# Patient Record
Sex: Male | Born: 1950 | Race: Black or African American | Hispanic: No | Marital: Married | Smoking: Never smoker
Health system: Southern US, Community
[De-identification: ages and names within clinical notes are randomized; demographics above are authoritative.]

## PROBLEM LIST (undated history)

## (undated) DIAGNOSIS — H269 Unspecified cataract: Secondary | ICD-10-CM

## (undated) DIAGNOSIS — C189 Malignant neoplasm of colon, unspecified: Secondary | ICD-10-CM

## (undated) HISTORY — DX: Unspecified cataract: H26.9

## (undated) HISTORY — DX: Malignant neoplasm of colon, unspecified: C18.9

---

## 2004-02-24 ENCOUNTER — Ambulatory Visit: Payer: Self-pay | Admitting: Internal Medicine

## 2005-06-01 ENCOUNTER — Ambulatory Visit: Payer: Self-pay | Admitting: Internal Medicine

## 2005-06-18 ENCOUNTER — Ambulatory Visit: Payer: Self-pay | Admitting: Internal Medicine

## 2006-11-24 ENCOUNTER — Ambulatory Visit: Payer: Self-pay | Admitting: Internal Medicine

## 2006-11-24 DIAGNOSIS — M25569 Pain in unspecified knee: Secondary | ICD-10-CM | POA: Insufficient documentation

## 2006-11-24 DIAGNOSIS — F528 Other sexual dysfunction not due to a substance or known physiological condition: Secondary | ICD-10-CM

## 2006-11-24 DIAGNOSIS — I10 Essential (primary) hypertension: Secondary | ICD-10-CM | POA: Insufficient documentation

## 2006-11-24 LAB — CONVERTED CEMR LAB: Testosterone: 424.74 ng/dL (ref 350.00–890)

## 2007-03-30 HISTORY — PX: COLECTOMY: SHX59

## 2007-06-13 ENCOUNTER — Telehealth (INDEPENDENT_AMBULATORY_CARE_PROVIDER_SITE_OTHER): Payer: Self-pay | Admitting: *Deleted

## 2007-06-20 ENCOUNTER — Ambulatory Visit: Payer: Self-pay | Admitting: Internal Medicine

## 2007-06-20 DIAGNOSIS — K625 Hemorrhage of anus and rectum: Secondary | ICD-10-CM | POA: Insufficient documentation

## 2007-06-22 LAB — CONVERTED CEMR LAB
Basophils Relative: 0.6 % (ref 0.0–1.0)
Eosinophils Absolute: 0.2 10*3/uL (ref 0.0–0.6)
Eosinophils Relative: 3.8 % (ref 0.0–5.0)
HCT: 39.2 % (ref 39.0–52.0)
Hemoglobin: 12.6 g/dL — ABNORMAL LOW (ref 13.0–17.0)
Lymphocytes Relative: 28.5 % (ref 12.0–46.0)
MCHC: 32.3 g/dL (ref 30.0–36.0)
MCV: 83.8 fL (ref 78.0–100.0)
Monocytes Absolute: 0.4 10*3/uL (ref 0.2–0.7)
Monocytes Relative: 9.1 % (ref 3.0–11.0)
Platelets: 209 10*3/uL (ref 150–400)
WBC: 4.8 10*3/uL (ref 4.5–10.5)

## 2007-06-28 ENCOUNTER — Ambulatory Visit: Payer: Self-pay | Admitting: Gastroenterology

## 2007-06-30 ENCOUNTER — Telehealth: Payer: Self-pay | Admitting: Internal Medicine

## 2007-07-20 ENCOUNTER — Ambulatory Visit: Payer: Self-pay | Admitting: Gastroenterology

## 2007-07-20 ENCOUNTER — Encounter: Payer: Self-pay | Admitting: Internal Medicine

## 2007-07-20 ENCOUNTER — Encounter: Payer: Self-pay | Admitting: Gastroenterology

## 2007-07-20 LAB — CONVERTED CEMR LAB: Creatinine, Ser: 1.1 mg/dL (ref 0.4–1.5)

## 2007-07-24 ENCOUNTER — Ambulatory Visit: Payer: Self-pay | Admitting: Cardiovascular Disease

## 2007-07-27 ENCOUNTER — Encounter: Payer: Self-pay | Admitting: Gastroenterology

## 2007-08-23 ENCOUNTER — Encounter: Payer: Self-pay | Admitting: Internal Medicine

## 2007-08-23 ENCOUNTER — Encounter (INDEPENDENT_AMBULATORY_CARE_PROVIDER_SITE_OTHER): Payer: Self-pay | Admitting: *Deleted

## 2007-08-23 ENCOUNTER — Inpatient Hospital Stay (HOSPITAL_COMMUNITY): Admission: RE | Admit: 2007-08-23 | Discharge: 2007-08-27 | Payer: Self-pay | Admitting: General Surgery

## 2007-08-23 ENCOUNTER — Encounter (INDEPENDENT_AMBULATORY_CARE_PROVIDER_SITE_OTHER): Payer: Self-pay | Admitting: General Surgery

## 2007-08-23 DIAGNOSIS — C189 Malignant neoplasm of colon, unspecified: Secondary | ICD-10-CM

## 2007-08-23 HISTORY — DX: Malignant neoplasm of colon, unspecified: C18.9

## 2007-08-31 ENCOUNTER — Encounter: Payer: Self-pay | Admitting: Internal Medicine

## 2007-09-13 ENCOUNTER — Ambulatory Visit: Payer: Self-pay | Admitting: Hematology and Oncology

## 2007-09-15 ENCOUNTER — Encounter: Payer: Self-pay | Admitting: Gastroenterology

## 2007-09-19 ENCOUNTER — Encounter: Payer: Self-pay | Admitting: Internal Medicine

## 2007-09-19 LAB — CBC WITH DIFFERENTIAL/PLATELET
Basophils Absolute: 0 10*3/uL (ref 0.0–0.1)
EOS%: 2.6 % (ref 0.0–7.0)
HGB: 11.4 g/dL — ABNORMAL LOW (ref 13.0–17.1)
MCH: 26.7 pg — ABNORMAL LOW (ref 28.0–33.4)
NEUT#: 3.3 10*3/uL (ref 1.5–6.5)
RDW: 14.8 % — ABNORMAL HIGH (ref 11.2–14.6)
lymph#: 1.3 10*3/uL (ref 0.9–3.3)

## 2007-09-19 LAB — COMPREHENSIVE METABOLIC PANEL
Albumin: 4.3 g/dL (ref 3.5–5.2)
Alkaline Phosphatase: 67 U/L (ref 39–117)
BUN: 12 mg/dL (ref 6–23)
CO2: 26 mEq/L (ref 19–32)
Calcium: 9.1 mg/dL (ref 8.4–10.5)
Chloride: 106 mEq/L (ref 96–112)
Glucose, Bld: 105 mg/dL — ABNORMAL HIGH (ref 70–99)
Potassium: 4.3 mEq/L (ref 3.5–5.3)
Sodium: 143 mEq/L (ref 135–145)
Total Protein: 7 g/dL (ref 6.0–8.3)

## 2007-09-19 LAB — CEA: CEA: 0.9 ng/mL (ref 0.0–5.0)

## 2007-09-21 ENCOUNTER — Ambulatory Visit (HOSPITAL_COMMUNITY): Admission: RE | Admit: 2007-09-21 | Discharge: 2007-09-21 | Payer: Self-pay | Admitting: Hematology and Oncology

## 2007-09-21 ENCOUNTER — Encounter (INDEPENDENT_AMBULATORY_CARE_PROVIDER_SITE_OTHER): Payer: Self-pay | Admitting: *Deleted

## 2007-09-26 ENCOUNTER — Encounter: Payer: Self-pay | Admitting: Gastroenterology

## 2007-10-11 ENCOUNTER — Encounter: Payer: Self-pay | Admitting: Internal Medicine

## 2007-10-11 ENCOUNTER — Encounter: Payer: Self-pay | Admitting: Gastroenterology

## 2007-10-11 LAB — COMPREHENSIVE METABOLIC PANEL
Albumin: 4.5 g/dL (ref 3.5–5.2)
BUN: 14 mg/dL (ref 6–23)
Calcium: 9.4 mg/dL (ref 8.4–10.5)
Chloride: 106 mEq/L (ref 96–112)
Glucose, Bld: 131 mg/dL — ABNORMAL HIGH (ref 70–99)
Potassium: 4 mEq/L (ref 3.5–5.3)

## 2007-10-11 LAB — CBC WITH DIFFERENTIAL/PLATELET
Basophils Absolute: 0 10*3/uL (ref 0.0–0.1)
Eosinophils Absolute: 0.2 10*3/uL (ref 0.0–0.5)
HCT: 38.2 % — ABNORMAL LOW (ref 38.7–49.9)
HGB: 12.7 g/dL — ABNORMAL LOW (ref 13.0–17.1)
MCV: 81.4 fL — ABNORMAL LOW (ref 81.6–98.0)
MONO%: 9 % (ref 0.0–13.0)
NEUT#: 3.3 10*3/uL (ref 1.5–6.5)
NEUT%: 59 % (ref 40.0–75.0)
RDW: 15.2 % — ABNORMAL HIGH (ref 11.2–14.6)

## 2007-10-20 ENCOUNTER — Encounter: Payer: Self-pay | Admitting: Gastroenterology

## 2007-10-20 ENCOUNTER — Ambulatory Visit: Payer: Self-pay | Admitting: Hematology and Oncology

## 2007-10-25 ENCOUNTER — Encounter: Payer: Self-pay | Admitting: Internal Medicine

## 2007-10-25 ENCOUNTER — Encounter: Payer: Self-pay | Admitting: Gastroenterology

## 2007-10-25 LAB — CBC WITH DIFFERENTIAL/PLATELET
Basophils Absolute: 0 10*3/uL (ref 0.0–0.1)
Eosinophils Absolute: 0.1 10*3/uL (ref 0.0–0.5)
HGB: 12.8 g/dL — ABNORMAL LOW (ref 13.0–17.1)
MONO#: 0.6 10*3/uL (ref 0.1–0.9)
NEUT#: 3 10*3/uL (ref 1.5–6.5)
RDW: 14.6 % (ref 11.2–14.6)
lymph#: 1.4 10*3/uL (ref 0.9–3.3)

## 2007-10-25 LAB — COMPREHENSIVE METABOLIC PANEL
AST: 14 U/L (ref 0–37)
Albumin: 4.4 g/dL (ref 3.5–5.2)
BUN: 9 mg/dL (ref 6–23)
Calcium: 9.5 mg/dL (ref 8.4–10.5)
Chloride: 104 mEq/L (ref 96–112)
Glucose, Bld: 117 mg/dL — ABNORMAL HIGH (ref 70–99)
Potassium: 4.1 mEq/L (ref 3.5–5.3)

## 2007-11-02 LAB — CBC WITH DIFFERENTIAL/PLATELET
BASO%: 1.1 % (ref 0.0–2.0)
EOS%: 2.6 % (ref 0.0–7.0)
HCT: 39.2 % (ref 38.7–49.9)
LYMPH%: 32.8 % (ref 14.0–48.0)
MCH: 27.4 pg — ABNORMAL LOW (ref 28.0–33.4)
MCHC: 33.2 g/dL (ref 32.0–35.9)
MONO#: 0.6 10*3/uL (ref 0.1–0.9)
NEUT%: 51.9 % (ref 40.0–75.0)
Platelets: 212 10*3/uL (ref 145–400)
RBC: 4.76 10*6/uL (ref 4.20–5.71)
WBC: 5 10*3/uL (ref 4.0–10.0)
lymph#: 1.6 10*3/uL (ref 0.9–3.3)

## 2007-11-20 ENCOUNTER — Telehealth: Payer: Self-pay | Admitting: *Deleted

## 2007-11-22 ENCOUNTER — Encounter: Payer: Self-pay | Admitting: Gastroenterology

## 2007-11-22 ENCOUNTER — Encounter: Payer: Self-pay | Admitting: Internal Medicine

## 2007-11-22 LAB — CBC WITH DIFFERENTIAL/PLATELET
BASO%: 0.3 % (ref 0.0–2.0)
EOS%: 2.5 % (ref 0.0–7.0)
HCT: 39.7 % (ref 38.7–49.9)
MCH: 28.1 pg (ref 28.0–33.4)
MCHC: 33.7 g/dL (ref 32.0–35.9)
MONO#: 0.7 10*3/uL (ref 0.1–0.9)
RDW: 16.8 % — ABNORMAL HIGH (ref 11.2–14.6)
WBC: 4.3 10*3/uL (ref 4.0–10.0)
lymph#: 1.3 10*3/uL (ref 0.9–3.3)

## 2007-11-22 LAB — COMPREHENSIVE METABOLIC PANEL
ALT: 55 U/L — ABNORMAL HIGH (ref 0–53)
AST: 55 U/L — ABNORMAL HIGH (ref 0–37)
Albumin: 4.4 g/dL (ref 3.5–5.2)
CO2: 25 mEq/L (ref 19–32)
Calcium: 9.2 mg/dL (ref 8.4–10.5)
Chloride: 103 mEq/L (ref 96–112)
Potassium: 4.2 mEq/L (ref 3.5–5.3)
Sodium: 140 mEq/L (ref 135–145)
Total Protein: 6.9 g/dL (ref 6.0–8.3)

## 2007-12-11 ENCOUNTER — Encounter: Payer: Self-pay | Admitting: Gastroenterology

## 2007-12-11 ENCOUNTER — Encounter: Payer: Self-pay | Admitting: Internal Medicine

## 2007-12-11 ENCOUNTER — Ambulatory Visit: Payer: Self-pay | Admitting: Hematology and Oncology

## 2007-12-13 ENCOUNTER — Encounter: Payer: Self-pay | Admitting: Gastroenterology

## 2007-12-13 ENCOUNTER — Encounter: Payer: Self-pay | Admitting: Internal Medicine

## 2007-12-13 LAB — CBC WITH DIFFERENTIAL/PLATELET
BASO%: 0.4 % (ref 0.0–2.0)
EOS%: 2.7 % (ref 0.0–7.0)
MCH: 28.8 pg (ref 28.0–33.4)
MCHC: 34.1 g/dL (ref 32.0–35.9)
MONO#: 0.6 10*3/uL (ref 0.1–0.9)
RBC: 4.28 10*6/uL (ref 4.20–5.71)
RDW: 18.3 % — ABNORMAL HIGH (ref 11.2–14.6)
WBC: 4 10*3/uL (ref 4.0–10.0)
lymph#: 1.4 10*3/uL (ref 0.9–3.3)

## 2007-12-13 LAB — COMPREHENSIVE METABOLIC PANEL
ALT: 38 U/L (ref 0–53)
AST: 43 U/L — ABNORMAL HIGH (ref 0–37)
CO2: 24 mEq/L (ref 19–32)
Calcium: 8.7 mg/dL (ref 8.4–10.5)
Chloride: 105 mEq/L (ref 96–112)
Sodium: 140 mEq/L (ref 135–145)
Total Protein: 6.5 g/dL (ref 6.0–8.3)

## 2008-01-03 ENCOUNTER — Encounter: Payer: Self-pay | Admitting: Gastroenterology

## 2008-01-03 ENCOUNTER — Encounter: Payer: Self-pay | Admitting: Internal Medicine

## 2008-01-03 LAB — COMPREHENSIVE METABOLIC PANEL
ALT: 33 U/L (ref 0–53)
AST: 45 U/L — ABNORMAL HIGH (ref 0–37)
Albumin: 4.3 g/dL (ref 3.5–5.2)
Alkaline Phosphatase: 64 U/L (ref 39–117)
Glucose, Bld: 117 mg/dL — ABNORMAL HIGH (ref 70–99)
Potassium: 3.9 mEq/L (ref 3.5–5.3)
Sodium: 142 mEq/L (ref 135–145)
Total Protein: 6.6 g/dL (ref 6.0–8.3)

## 2008-01-03 LAB — CBC WITH DIFFERENTIAL/PLATELET
BASO%: 0.3 % (ref 0.0–2.0)
Eosinophils Absolute: 0.1 10*3/uL (ref 0.0–0.5)
MCV: 85.6 fL (ref 81.6–98.0)
MONO%: 14.2 % — ABNORMAL HIGH (ref 0.0–13.0)
NEUT#: 2.8 10*3/uL (ref 1.5–6.5)
RBC: 4.15 10*6/uL — ABNORMAL LOW (ref 4.20–5.71)
RDW: 20.7 % — ABNORMAL HIGH (ref 11.2–14.6)
WBC: 4.9 10*3/uL (ref 4.0–10.0)

## 2008-01-24 ENCOUNTER — Encounter: Payer: Self-pay | Admitting: Internal Medicine

## 2008-01-24 LAB — CBC WITH DIFFERENTIAL/PLATELET
BASO%: 0.5 % (ref 0.0–2.0)
EOS%: 2.5 % (ref 0.0–7.0)
LYMPH%: 30.5 % (ref 14.0–48.0)
MCH: 29.6 pg (ref 28.0–33.4)
MCHC: 33.8 g/dL (ref 32.0–35.9)
MCV: 87.6 fL (ref 81.6–98.0)
MONO%: 17.1 % — ABNORMAL HIGH (ref 0.0–13.0)
NEUT#: 2.4 10*3/uL (ref 1.5–6.5)
RBC: 4.36 10*6/uL (ref 4.20–5.71)
RDW: 21.1 % — ABNORMAL HIGH (ref 11.2–14.6)

## 2008-01-25 LAB — COMPREHENSIVE METABOLIC PANEL
ALT: 41 U/L (ref 0–53)
Albumin: 4.7 g/dL (ref 3.5–5.2)
CO2: 24 mEq/L (ref 19–32)
Calcium: 9.6 mg/dL (ref 8.4–10.5)
Chloride: 102 mEq/L (ref 96–112)
Creatinine, Ser: 1.16 mg/dL (ref 0.40–1.50)
Potassium: 4.3 mEq/L (ref 3.5–5.3)
Total Protein: 7.5 g/dL (ref 6.0–8.3)

## 2008-01-25 LAB — CEA: CEA: 1.4 ng/mL (ref 0.0–5.0)

## 2008-02-12 ENCOUNTER — Ambulatory Visit: Payer: Self-pay | Admitting: Hematology and Oncology

## 2008-02-14 ENCOUNTER — Encounter: Payer: Self-pay | Admitting: Gastroenterology

## 2008-02-14 LAB — COMPREHENSIVE METABOLIC PANEL
Albumin: 4.4 g/dL (ref 3.5–5.2)
Alkaline Phosphatase: 70 U/L (ref 39–117)
CO2: 26 mEq/L (ref 19–32)
Glucose, Bld: 108 mg/dL — ABNORMAL HIGH (ref 70–99)
Potassium: 4.2 mEq/L (ref 3.5–5.3)
Sodium: 139 mEq/L (ref 135–145)
Total Protein: 7.1 g/dL (ref 6.0–8.3)

## 2008-02-14 LAB — CBC WITH DIFFERENTIAL/PLATELET
BASO%: 0.6 % (ref 0.0–2.0)
EOS%: 2.1 % (ref 0.0–7.0)
HCT: 36.2 % — ABNORMAL LOW (ref 38.7–49.9)
LYMPH%: 30.2 % (ref 14.0–48.0)
MCH: 30.5 pg (ref 28.0–33.4)
MCHC: 34.3 g/dL (ref 32.0–35.9)
MCV: 89 fL (ref 81.6–98.0)
MONO#: 0.8 10*3/uL (ref 0.1–0.9)
MONO%: 18.3 % — ABNORMAL HIGH (ref 0.0–13.0)
NEUT%: 48.8 % (ref 40.0–75.0)
Platelets: 127 10*3/uL — ABNORMAL LOW (ref 145–400)
RBC: 4.07 10*6/uL — ABNORMAL LOW (ref 4.20–5.71)
WBC: 4.6 10*3/uL (ref 4.0–10.0)

## 2008-03-06 ENCOUNTER — Encounter: Payer: Self-pay | Admitting: Gastroenterology

## 2008-04-02 ENCOUNTER — Ambulatory Visit (HOSPITAL_COMMUNITY): Admission: RE | Admit: 2008-04-02 | Discharge: 2008-04-02 | Payer: Self-pay | Admitting: Hematology and Oncology

## 2008-04-02 ENCOUNTER — Encounter (INDEPENDENT_AMBULATORY_CARE_PROVIDER_SITE_OTHER): Payer: Self-pay | Admitting: *Deleted

## 2008-04-02 ENCOUNTER — Ambulatory Visit: Payer: Self-pay | Admitting: Hematology and Oncology

## 2008-04-04 ENCOUNTER — Encounter: Payer: Self-pay | Admitting: Gastroenterology

## 2008-04-04 LAB — CBC WITH DIFFERENTIAL/PLATELET
BASO%: 0.4 % (ref 0.0–2.0)
Eosinophils Absolute: 0.1 10*3/uL (ref 0.0–0.5)
MCHC: 33.6 g/dL (ref 32.0–35.9)
MCV: 92.9 fL (ref 81.6–98.0)
MONO#: 0.8 10*3/uL (ref 0.1–0.9)
MONO%: 19.4 % — ABNORMAL HIGH (ref 0.0–13.0)
NEUT#: 1.9 10*3/uL (ref 1.5–6.5)
RBC: 3.99 10*6/uL — ABNORMAL LOW (ref 4.20–5.71)
RDW: 16.8 % — ABNORMAL HIGH (ref 11.2–14.6)
WBC: 4.3 10*3/uL (ref 4.0–10.0)

## 2008-04-05 LAB — COMPREHENSIVE METABOLIC PANEL
ALT: 34 U/L (ref 0–53)
AST: 44 U/L — ABNORMAL HIGH (ref 0–37)
CO2: 20 mEq/L (ref 19–32)
Sodium: 141 mEq/L (ref 135–145)
Total Bilirubin: 0.6 mg/dL (ref 0.3–1.2)
Total Protein: 7 g/dL (ref 6.0–8.3)

## 2008-04-05 LAB — CEA: CEA: 1.7 ng/mL (ref 0.0–5.0)

## 2008-05-15 ENCOUNTER — Encounter (INDEPENDENT_AMBULATORY_CARE_PROVIDER_SITE_OTHER): Payer: Self-pay | Admitting: *Deleted

## 2008-07-22 ENCOUNTER — Ambulatory Visit: Payer: Self-pay | Admitting: Gastroenterology

## 2008-07-23 ENCOUNTER — Ambulatory Visit: Payer: Self-pay | Admitting: Hematology and Oncology

## 2008-07-25 ENCOUNTER — Ambulatory Visit (HOSPITAL_COMMUNITY): Admission: RE | Admit: 2008-07-25 | Discharge: 2008-07-25 | Payer: Self-pay | Admitting: Hematology and Oncology

## 2008-07-25 ENCOUNTER — Encounter (INDEPENDENT_AMBULATORY_CARE_PROVIDER_SITE_OTHER): Payer: Self-pay | Admitting: *Deleted

## 2008-07-25 LAB — CBC WITH DIFFERENTIAL/PLATELET
Basophils Absolute: 0 10*3/uL (ref 0.0–0.1)
EOS%: 3.2 % (ref 0.0–7.0)
Eosinophils Absolute: 0.2 10*3/uL (ref 0.0–0.5)
HGB: 13.1 g/dL (ref 13.0–17.1)
NEUT#: 2.7 10*3/uL (ref 1.5–6.5)
RDW: 13.2 % (ref 11.0–14.6)
lymph#: 1.7 10*3/uL (ref 0.9–3.3)

## 2008-07-25 LAB — COMPREHENSIVE METABOLIC PANEL
Albumin: 4.6 g/dL (ref 3.5–5.2)
BUN: 13 mg/dL (ref 6–23)
Calcium: 9 mg/dL (ref 8.4–10.5)
Chloride: 105 mEq/L (ref 96–112)
Glucose, Bld: 102 mg/dL — ABNORMAL HIGH (ref 70–99)
Potassium: 3.9 mEq/L (ref 3.5–5.3)

## 2008-08-01 ENCOUNTER — Encounter: Payer: Self-pay | Admitting: Internal Medicine

## 2008-08-06 ENCOUNTER — Ambulatory Visit: Payer: Self-pay | Admitting: Gastroenterology

## 2008-11-28 ENCOUNTER — Ambulatory Visit: Payer: Self-pay | Admitting: Hematology and Oncology

## 2008-12-03 LAB — CBC WITH DIFFERENTIAL/PLATELET
Basophils Absolute: 0 10*3/uL (ref 0.0–0.1)
EOS%: 3.2 % (ref 0.0–7.0)
HCT: 37.6 % — ABNORMAL LOW (ref 38.4–49.9)
HGB: 12.6 g/dL — ABNORMAL LOW (ref 13.0–17.1)
MCH: 27.6 pg (ref 27.2–33.4)
MONO#: 0.4 10*3/uL (ref 0.1–0.9)
NEUT%: 56.1 % (ref 39.0–75.0)
lymph#: 1.6 10*3/uL (ref 0.9–3.3)

## 2008-12-03 LAB — COMPREHENSIVE METABOLIC PANEL
BUN: 12 mg/dL (ref 6–23)
CO2: 27 mEq/L (ref 19–32)
Calcium: 9.3 mg/dL (ref 8.4–10.5)
Chloride: 109 mEq/L (ref 96–112)
Creatinine, Ser: 1.27 mg/dL (ref 0.40–1.50)

## 2008-12-04 LAB — CEA: CEA: 0.8 ng/mL (ref 0.0–5.0)

## 2008-12-05 ENCOUNTER — Encounter: Payer: Self-pay | Admitting: Gastroenterology

## 2009-03-27 ENCOUNTER — Ambulatory Visit: Payer: Self-pay | Admitting: Hematology and Oncology

## 2009-04-02 LAB — COMPREHENSIVE METABOLIC PANEL
ALT: 70 U/L — ABNORMAL HIGH (ref 0–53)
AST: 61 U/L — ABNORMAL HIGH (ref 0–37)
Albumin: 4.6 g/dL (ref 3.5–5.2)
BUN: 12 mg/dL (ref 6–23)
CO2: 29 mEq/L (ref 19–32)
Chloride: 104 mEq/L (ref 96–112)
Glucose, Bld: 104 mg/dL — ABNORMAL HIGH (ref 70–99)
Sodium: 139 mEq/L (ref 135–145)

## 2009-04-02 LAB — CBC WITH DIFFERENTIAL/PLATELET
Basophils Absolute: 0 10*3/uL (ref 0.0–0.1)
HCT: 40.4 % (ref 38.4–49.9)
MCV: 83.3 fL (ref 79.3–98.0)
MONO#: 0.6 10*3/uL (ref 0.1–0.9)
RBC: 4.85 10*6/uL (ref 4.20–5.82)
RDW: 14.1 % (ref 11.0–14.6)
WBC: 6 10*3/uL (ref 4.0–10.3)

## 2009-04-02 LAB — CEA: CEA: 1 ng/mL (ref 0.0–5.0)

## 2009-04-04 ENCOUNTER — Encounter: Payer: Self-pay | Admitting: Internal Medicine

## 2009-07-25 ENCOUNTER — Ambulatory Visit: Payer: Self-pay | Admitting: Hematology and Oncology

## 2009-07-28 ENCOUNTER — Ambulatory Visit (HOSPITAL_COMMUNITY): Admission: RE | Admit: 2009-07-28 | Discharge: 2009-07-28 | Payer: Self-pay | Admitting: Hematology and Oncology

## 2009-07-28 ENCOUNTER — Encounter (INDEPENDENT_AMBULATORY_CARE_PROVIDER_SITE_OTHER): Payer: Self-pay | Admitting: *Deleted

## 2009-07-28 LAB — COMPREHENSIVE METABOLIC PANEL
Alkaline Phosphatase: 58 U/L (ref 39–117)
BUN: 10 mg/dL (ref 6–23)
CO2: 26 mEq/L (ref 19–32)
Chloride: 108 mEq/L (ref 96–112)
Creatinine, Ser: 1.11 mg/dL (ref 0.40–1.50)
Glucose, Bld: 101 mg/dL — ABNORMAL HIGH (ref 70–99)

## 2009-07-28 LAB — CBC WITH DIFFERENTIAL/PLATELET
BASO%: 0.4 % (ref 0.0–2.0)
Basophils Absolute: 0 10*3/uL (ref 0.0–0.1)
EOS%: 3 % (ref 0.0–7.0)
Eosinophils Absolute: 0.1 10*3/uL (ref 0.0–0.5)
HCT: 40.7 % (ref 38.4–49.9)
HGB: 13.3 g/dL (ref 13.0–17.1)
LYMPH%: 38.5 % (ref 14.0–49.0)
MCH: 27.4 pg (ref 27.2–33.4)
MONO#: 0.4 10*3/uL (ref 0.1–0.9)
MONO%: 8.7 % (ref 0.0–14.0)
WBC: 4.4 10*3/uL (ref 4.0–10.3)
lymph#: 1.7 10*3/uL (ref 0.9–3.3)

## 2009-08-01 ENCOUNTER — Encounter (INDEPENDENT_AMBULATORY_CARE_PROVIDER_SITE_OTHER): Payer: Self-pay | Admitting: *Deleted

## 2009-08-06 ENCOUNTER — Encounter: Payer: Self-pay | Admitting: Internal Medicine

## 2009-08-06 ENCOUNTER — Encounter (INDEPENDENT_AMBULATORY_CARE_PROVIDER_SITE_OTHER): Payer: Self-pay | Admitting: *Deleted

## 2009-11-05 ENCOUNTER — Ambulatory Visit: Payer: Self-pay | Admitting: Gastroenterology

## 2009-11-05 DIAGNOSIS — Z85038 Personal history of other malignant neoplasm of large intestine: Secondary | ICD-10-CM

## 2009-11-21 ENCOUNTER — Ambulatory Visit: Payer: Self-pay | Admitting: Gastroenterology

## 2010-01-28 ENCOUNTER — Ambulatory Visit: Payer: Self-pay | Admitting: Hematology and Oncology

## 2010-01-30 LAB — CBC WITH DIFFERENTIAL/PLATELET
Basophils Absolute: 0 10*3/uL (ref 0.0–0.1)
HCT: 42.1 % (ref 38.4–49.9)
MCH: 27.7 pg (ref 27.2–33.4)
MCHC: 33 g/dL (ref 32.0–36.0)
MCV: 83.9 fL (ref 79.3–98.0)
MONO#: 0.5 10*3/uL (ref 0.1–0.9)
NEUT#: 2.8 10*3/uL (ref 1.5–6.5)
RBC: 5.02 10*6/uL (ref 4.20–5.82)
RDW: 14 % (ref 11.0–14.6)
WBC: 5.3 10*3/uL (ref 4.0–10.3)
nRBC: 0 % (ref 0–0)

## 2010-01-30 LAB — COMPREHENSIVE METABOLIC PANEL
Albumin: 4.5 g/dL (ref 3.5–5.2)
Alkaline Phosphatase: 59 U/L (ref 39–117)
BUN: 12 mg/dL (ref 6–23)
Calcium: 9.2 mg/dL (ref 8.4–10.5)
Chloride: 105 mEq/L (ref 96–112)
Creatinine, Ser: 1.31 mg/dL (ref 0.40–1.50)
Potassium: 4.4 mEq/L (ref 3.5–5.3)
Total Bilirubin: 0.6 mg/dL (ref 0.3–1.2)
Total Protein: 7.3 g/dL (ref 6.0–8.3)

## 2010-02-03 ENCOUNTER — Encounter: Payer: Self-pay | Admitting: Gastroenterology

## 2010-02-04 LAB — HEPATITIS B SURFACE ANTIGEN: Hepatitis B Surface Ag: NEGATIVE

## 2010-02-04 LAB — HEPATITIS C ANTIBODY: HCV Ab: NEGATIVE

## 2010-02-04 LAB — HEPATITIS B SURFACE ANTIBODY,QUALITATIVE: Hep B S Ab: NEGATIVE

## 2010-04-28 NOTE — Letter (Signed)
Summary: [facility]  [address] [place], [place] [address]   Phone: [phone]  Fax: [phone]       Jeffery [name]    06-02-1950    MRN: [id number]        Procedure Day /Date: Friday August 26th, 2011     Arrival Time:3:00pm      Procedure Time:4:00pm     Location of Procedure:                    _ x_  [facility] (4th Floor)    PREPARATION FOR FLEXIBLE SIGMOIDOSCOPY WITH MAGNESIUM CITRATE  Prior to the day before your procedure, purchase one 8 oz. bottle of Magnesium Citrate and one Fleet Enema from the laxative section of your drugstore.  _________________________________________________________________________________________________  THE DAY BEFORE YOUR PROCEDURE             DATE: 11/20/09      DAY: Thursday  1.   Have a clear liquid dinner the night before your procedure.  2.   Do not drink anything colored red or purple.  Avoid juices with pulp.  No orange juice.              CLEAR LIQUIDS INCLUDE: Water Jello Ice Popsicles Tea (sugar ok, no milk/cream) Powdered fruit flavored drinks Coffee (sugar ok, no milk/cream) Gatorade Juice: apple, white grape, white cranberry  Lemonade Clear bullion, consomm, broth Carbonated beverages (any kind) Strained chicken noodle soup Hard Candy   3.   At 7:00 pm the night before your procedure, drink one bottle of Magnesium Citrate over ice.  4.   Drink at least 3 more glasses of clear liquids before bedtime (preferably juices).  5.   Results are expected usually within 1 to 6 hours after taking the Magnesium Citrate.  ___________________________________________________________________________________________________  THE DAY OF YOUR PROCEDURE            DATE: 11/21/09     DAY: Friday  1.   Use Fleet Enema one hour prior to coming for procedure.  2.   You may drink clear liquids until 2:00pm (2 hours before exam)       MEDICATION INSTRUCTIONS  Unless  otherwise instructed, you should take regular prescription medications with a small sip of water as early as possible the morning of your procedure.        OTHER INSTRUCTIONS  You will need a responsible adult at least 62 years of age to accompany you and drive you home.   This person must remain in the waiting room during your procedure.  Wear loose fitting clothing that is easily removed.  Leave jewelry and other valuables at home.  However, you may wish to bring a book to read or an iPod/MP3 player to listen to music as you wait for your procedure to start.  Remove all body piercing jewelry and leave at home.  Total time from sign-in until discharge is approximately 2-3 hours.  You should go home directly after your procedure and rest.  You can resume normal activities the day after your procedure.  The day of your procedure you should not:   Drive   Make legal decisions   Operate machinery   Drink alcohol   Return to work  You will receive specific instructions about eating, activities and medications before you leave.   The above instructions have been reviewed and explained to me by   Marchelle Folks.     I fully  understand and can verbalize these instructions _____________________________ Date _________

## 2010-04-28 NOTE — Letter (Signed)
Summary: [facility]  [facility]   Imported By: Lennie Odor 02/09/2010 11:47:37  _____________________________________________________________________  External Attachment:    Type:   Image     Comment:   External Document

## 2010-04-28 NOTE — Consult Note (Signed)
Summary: [facility]   Imported By: Sherian Rein 09/12/2009 13:08:40  _____________________________________________________________________  External Attachment:    Type:   Image     Comment:   External Document

## 2010-04-28 NOTE — Letter (Signed)
[facility]   Imported By: Maryln Gottron 05/01/2009 11:11:34  _____________________________________________________________________  External Attachment:    Type:   Image     Comment:   External Document

## 2010-04-28 NOTE — Assessment & Plan Note (Signed)
Summary: SCREEN FOR RECALL/YF   History of Present Illness Visit Type: follow up Primary GI MD: Elie Goody MD [facility] Primary Provider: Birdie Sons, MD  Requesting Provider: Jamie Brookes. Odogwu, MD  Chief Complaint: Recall Sigmoidoscopy. History of colon cancer.  Pt c/o bloating  History of Present Illness:   This is a 60 year old male with a history of synchronous T4 and T3 colon cancers, status post subtotal colectomy in 2009. He is status post chemotherapy and has done well since that time with no evidence of recurrence. He had a negative CT scan in May 2011. He notes no change in bowel habits, melena, hematochezia or weight loss. He generally has 4-5 stools daily.   GI Review of Systems    Reports bloating.      Denies abdominal pain, acid reflux, belching, chest pain, dysphagia with liquids, dysphagia with solids, heartburn, loss of appetite, nausea, vomiting, vomiting blood, weight loss, and  weight gain.        Denies anal fissure, black tarry stools, change in bowel habit, constipation, diarrhea, diverticulosis, fecal incontinence, heme positive stool, hemorrhoids, irritable bowel syndrome, jaundice, light color stool, liver problems, rectal bleeding, and  rectal pain.   Current Medications (verified): 1)  Tylenol 325 Mg Tabs (Acetaminophen) .... One Tablet By Mouth As Needed 2)  Cialis 20 Mg Tabs (Tadalafil) .... Take One Tablet By Mouth As Needed 3)  Mens 50+ Advanced  Caps (Multiple Vitamins-Minerals) .... One Capsule By Mouth Once Daily  Allergies (verified): No Known Drug Allergies  Past History:  Past Medical History: Synchronous T4, N0 and T3, N0 colonic adenocarcinoma, 06/2007 ED  Hypertension Hemorrhoids  Past Surgical History: Subcolectomy 07/2007  Family History: No FH of Colon Cancer:  Social History: Supervisor Married 2 Childern Patient has never smoked.  Daily Caffeine Use: 2 daily  Illicit Drug Use - no Alcohol Use - no Smoking Status:   never Drug Use:  no  Review of Systems       The pertinent positives and negatives are noted as above and in the HPI. All other ROS were reviewed and were negative.   Vital Signs:  Patient profile:   60 year old male Height:      67 inches Weight:      188 pounds BMI:     29.55 BSA:     1.97 Pulse rate:   68 / minute Pulse rhythm:   regular BP sitting:   142 / 80  (left arm) Cuff size:   regular  Vitals Entered By: Ok Anis CMA (November 05, 2009 2:21 PM)  Physical Exam  General:  Well developed, well nourished, no acute distress. Head:  Normocephalic and atraumatic. Eyes:  PERRLA, no icterus. Mouth:  No deformity or lesions, dentition normal. Lungs:  Clear throughout to auscultation. Heart:  Regular rate and rhythm; no murmurs, rubs,  or bruits. Abdomen:  Soft, nontender and nondistended. No masses, hepatosplenomegaly or hernias noted. Normal bowel sounds. Rectal:  deferred until time of colonoscopy.   Neurologic:  Alert and  oriented x4;  grossly normal neurologically. Psych:  Alert and cooperative. Normal mood and affect.  Impression & Recommendations:  Problem # 1:  PERSONAL HX COLON CANCER (ICD-V10.05) History of synchronous T4 and T3 colonic adenocarcinomas, status post total colectomy in 2009. He is due for surveillance with sigmoidoscopy. He was given the option of a nonsedated or sedated procedure. Orders: Flex with Sedation (Flex w/Sed)  Patient Instructions: 1)   Flexible Sigmoidoscopy brochure given.  2)  Copy sent to: Arlan Organ, MD 3)  The medication list was reviewed and reconciled.  All changed / newly prescribed medications were explained.  A complete medication list was provided to the patient / caregiver.

## 2010-04-28 NOTE — Letter (Signed)
Summary: New Patient letter  [facility]  [address] [place], [place] [address]   Phone: [phone]  Fax: [phone]       08/06/2009 MRN: [id number]  Jeffery [name] [address] [place], [place]  [address]  Dear Mr. Judithann Graves,  Welcome to the Gastroenterology Division at [facility].    You are scheduled to see Dr.  Russella Dar on  09-15-09 at 3:30pm on the 3rd floor at [facility], [address]  We ask that you try to arrive at our office 15 minutes prior to your appointment time to allow for check-in.  We would like you to complete the enclosed self-administered evaluation form prior to your visit and bring it with you on the day of your appointment.  We will review it with you.  Also, please bring a complete list of all your medications or, if you prefer, bring the medication bottles and we will list them.  Please bring your insurance card so that we may make a copy of it.  If your insurance requires a referral to see a specialist, please bring your referral form from your primary care physician.  Co-payments are due at the time of your visit and may be paid by cash, check or credit card.     Your office visit will consist of a consult with your physician (includes a physical exam), any laboratory testing he/she may order, scheduling of any necessary diagnostic testing (e.g. x-ray, ultrasound, CT-scan), and scheduling of a procedure (e.g. Endoscopy, Colonoscopy) if required.  Please allow enough time on your schedule to allow for any/all of these possibilities.    If you cannot keep your appointment, please call [phone] to cancel or reschedule prior to your appointment date.  This allows [place] the opportunity to schedule an appointment for another patient in need of care.  If you do not cancel or reschedule by 5 p.m. the business day prior to your appointment date, you will be charged a $50.00 late cancellation/no-show fee.    Thank you for choosing  [facility] for your medical needs.  We appreciate the opportunity to care for you.  Please visit [place] at our website  to learn more about our practice.                     Sincerely,                                                             The Gastroenterology Division

## 2010-04-28 NOTE — Discharge Summary (Signed)
Summary: Colon cancer/ Total abdominal colectomy  NAME:  Jeffery [name], Jeffery [name]               ACCOUNT NO.:  [id number]      MEDICAL RECORD NO.:  [id number]          PATIENT TYPE:  INP      LOCATION:  1527                         FACILITY:  [facility]      PHYSICIAN:  [name], M.D.DATE OF BIRTH:  1950-06-25      DATE OF ADMISSION:  08/23/2007   DATE OF DISCHARGE:  08/27/2007                                  DISCHARGE SUMMARY      FINAL DIAGNOSIS:  Colonic adenocarcinoma, ascending colon and sigmoid   colon.      SECONDARY DIAGNOSES:  None.      PROCEDURE:  Total abdominal colectomy.      REASON FOR ADMISSION:  This 60 year old male had some blood in his stool   and change in stool caliber.  He underwent a colonoscopy and a lesion in   the ascending colon and sigmoid colon were identified, biopsied and both   positive for adenocarcinoma.  He now presents for the above procedure.      HOSPITAL COURSE:  He was admitted and underwent the total abdominal   colectomy with the anastomosis between the ileum and rectum.  He   actually tolerated this very well.  He began having flatus and bowel   movements by his second postoperative day and his diet was advanced.   His wound looked good.  By his fourth postoperative day he was   tolerating a diet, had good bowel function, the wound looked good, and   he was discharged.      DISPOSITION:  Discharged to home in satisfactory condition Aug 27, 2007.   He will to come to see me in 5-7 days for staple removal.  He is given   Tylox for pain and discharge instructions.               [name], M.D.   Electronically Signed            TJR/MEDQ  D:  09/15/2007  T:  09/15/2007  Job:  [id number]      cc:   [name], MD, FACG   [address]   [place]   [place] [address]      [facility]

## 2010-04-28 NOTE — Letter (Signed)
Summary: Flex Sigmoidoscopy Letter  [facility]  [address] [place], [place] [address]   Phone: [phone]  Fax: [phone]      Aug 01, 2009 MRN: [id number]   Jeffery [name] [address] [place], [place]  [address]   Dear Jeffery [name],   According to your medical record, it is time for you to schedule a Flexible Sigmoidoscopy. The American Cancer Society recommends this procedure as a method to detect early colon cancer. Patients with a family history of colon cancer, or a personal history of colon polyps or imflammatory bowel disease are at increased risk.  This letter has been generated based on the recommendations made at the time of your prior procedure. If you feel that in your particular situation this may no longer apply, please contact our office.  Please call our office at [phone]) to schedule this appointment or to update your records at your earliest convenience.  Thank you for cooperating with [place] to provide you with the very best care possible.   Sincerely,  Judie Petit T. Russella Dar, M.D.  [facility] [phone]

## 2010-04-28 NOTE — Procedures (Signed)
Summary: Flexible Sigmoidoscopy  Patient: Jeffery [name] Note: All result statuses are Final unless otherwise noted.  Tests: (1) Flexible Sigmoidoscopy (FLX)  FLX Flexible Sigmoidoscopy                             DONE     [facility]     [address]     [place], [place]  [address]           FLEXIBLE SIGMOIDOSCOPY PROCEDURE REPORT     PATIENT:  Jeffery [name], Jeffery [name]  MR#:  [id number]     BIRTHDATE:  01-25-1951, 59 yrs. old  GENDER:  male     ENDOSCOPIST:  Judie Petit T. Russella Dar, MD, [facility]           PROCEDURE DATE:  11/21/2009     PROCEDURE:  Flexible Sigmoidoscopy, diagnostic     ASA CLASS:  Class II     INDICATIONS:  history of synchronous T4 and T3 colon cancers,     2009, high risk screening and surveillance     MEDICATIONS:   Fentanyl 50 mcg IV, Versed 5 mg IV           DESCRIPTION OF PROCEDURE:   After the risks benefits and     alternatives of the procedure were thoroughly explained, informed     consent was obtained.  Digital rectal exam was performed and     revealed no abnormalities.   The [id number] endoscope     was introduced through the anus and advanced to the ileum, without     limitations.  The quality of the prep was good.  The instrument     was then slowly withdrawn as the mucosa was fully examined.     <<PROCEDUREIMAGES>>     There was a surgical anastomosis in the proximal rectum. The     distal neoterminal ileum, rectum and anastomosis were normal in     appearance.  Retroflexed views in the rectum revealed small     hemorrhoids.  The scope was then withdrawn from the patient and     the procedure completed.     COMPLICATIONS:  None           ENDOSCOPIC IMPRESSION:     1) Ileocolonic anastomosis in the rectum     2) Small internal hemorrhoids           RECOMMENDATIONS:     1) Continue surveillance with Flex Sig in 2 years           Malcolm T. Russella Dar, MD, [facility]           CC:  Arlan Organ, MD           n.     Rosalie DoctorVenita Lick. Stark  at 11/21/2009 03:51 PM           Barbee [place]ropshire, [id number]  Note: An exclamation mark (!) indicates a result that was not dispersed into the flowsheet. Document Creation Date: 11/21/2009 3:52 PM _______________________________________________________________________  (1) Order result status: Final Collection or observation date-time: 11/21/2009 15:42 Requested date-time:  Receipt date-time:  Reported date-time:  Referring Physician:   Ordering Physician: Claudette Head [phone]) Specimen Source:  Source: Launa Grill Order Number: [id number] Lab site:   Appended Document: Flexible Sigmoidoscopy     Procedures Next Due Date:    Flexible Sigmoidoscopy: 10/2011

## 2010-04-28 NOTE — Letter (Signed)
[facility]   Imported By: Maryln Gottron 08/21/2009 13:46:15  _____________________________________________________________________  External Attachment:    Type:   Image     Comment:   External Document

## 2010-08-04 ENCOUNTER — Other Ambulatory Visit: Payer: Self-pay | Admitting: Hematology and Oncology

## 2010-08-04 ENCOUNTER — Encounter (HOSPITAL_BASED_OUTPATIENT_CLINIC_OR_DEPARTMENT_OTHER): Payer: 59 | Admitting: Hematology and Oncology

## 2010-08-04 DIAGNOSIS — C189 Malignant neoplasm of colon, unspecified: Secondary | ICD-10-CM

## 2010-08-04 DIAGNOSIS — C182 Malignant neoplasm of ascending colon: Secondary | ICD-10-CM

## 2010-08-04 DIAGNOSIS — C187 Malignant neoplasm of sigmoid colon: Secondary | ICD-10-CM

## 2010-08-04 LAB — COMPREHENSIVE METABOLIC PANEL
Albumin: 4.9 g/dL (ref 3.5–5.2)
BUN: 12 mg/dL (ref 6–23)
CO2: 20 mEq/L (ref 19–32)
Calcium: 9.2 mg/dL (ref 8.4–10.5)
Chloride: 107 mEq/L (ref 96–112)
Glucose, Bld: 93 mg/dL (ref 70–99)
Potassium: 4.1 mEq/L (ref 3.5–5.3)
Total Protein: 7.6 g/dL (ref 6.0–8.3)

## 2010-08-04 LAB — CBC WITH DIFFERENTIAL/PLATELET
Basophils Absolute: 0 10*3/uL (ref 0.0–0.1)
Eosinophils Absolute: 0.2 10*3/uL (ref 0.0–0.5)
HCT: 40.3 % (ref 38.4–49.9)
HGB: 13.1 g/dL (ref 13.0–17.1)
MCH: 26.7 pg — ABNORMAL LOW (ref 27.2–33.4)
MONO#: 0.5 10*3/uL (ref 0.1–0.9)
NEUT#: 3.4 10*3/uL (ref 1.5–6.5)
NEUT%: 54.2 % (ref 39.0–75.0)
WBC: 6.4 10*3/uL (ref 4.0–10.3)
lymph#: 2.2 10*3/uL (ref 0.9–3.3)

## 2010-08-11 NOTE — Op Note (Signed)
NAME:  Jeffery [name], Jeffery [name] NO.:  [id number]   MEDICAL RECORD NO.:  [id number]          PATIENT TYPE:  INP   LOCATION:  0008                         FACILITY:  [facility]   PHYSICIAN:  [name], M.D.DATE OF BIRTH:  Jul 29, 1950   DATE OF PROCEDURE:  08/23/2007  DATE OF DISCHARGE:                               OPERATIVE REPORT   PREOPERATIVE DIAGNOSIS:  Right colon cancer and sigmoid colon cancer.   POSTOPERATIVE DIAGNOSIS:  Right colon cancer and sigmoid colon cancer.   PROCEDURE:  Total colectomy.   SURGEON:  [name], M.D.   ASSISTANT:  Lennie Muckle, MD.   ANESTHESIA:  General.   INDICATIONS:  This 57-year male had some rectal bleeding and underwent  colonoscopy.  He is found to have to malignancies, one in the ascending  colon and one in the sigmoid colon.  CT scan is negative for metastatic  disease.  He is now brought to the operating room for the above  procedure.   TECHNIQUE:  He is brought to the operating room, placed supine on the  operating table and general anesthetic was administered.  Foley catheter  was inserted and he is placed in the lithotomy position.  The hair on  the abdominal wall was clipped and the abdominal wall and perineum were  sterilely prepped and draped.  A long midline incision was made dividing  the skin, subcutaneous tissue, fascia and peritoneum entering the  peritoneal cavity.  I then explored the peritoneal cavity.  The liver  was smooth without lesions.  No small bowel lesions were noted.  No drop  metastasis into the pelvis were noted.   I approached the ascending colon and mobilized it by dividing the  lateral attachments using electrocautery and medialized the colon.  I  then proceeded to dissect the omentum free from the transverse colon.  I  then mobilized the descending colon by dividing its lateral attachments  and then mobilized the splenic flexure as well using electrocautery and  the LigaSure.   Following this, the sigmoid colon was mobilized after  identification of the left ureter and I traced it down into the pelvis.  I then reached the rectosigmoid junction.   At the distal ileum, I divided the terminal ileum with the GIA stapler.  I then identified the right ureter.  Staying above this, I divided the  mesentery using LigaSure and ligating the ileocolic and right colic  vessels.  I then mobilized the hepatic flexure  further and there was  some bleeding around the mesentery which was controlled with hemoclips  and stitches  close to the pancreas.  I then divided the middle colic  vessels and ligated them with silk ties.  I divided the left colic  vessel, ligated it with silk tie.  I then proceeded down toward the  sigmoid vessels, taking a large sample of mesentery around the entire  colon area.  I brought the mesenteric resection up to the rectosigmoid  junction, dividing the sigmoid vessels with the LigaSure above the plane  of the left ureter.  Using the GIA stapler,  I then divided the colon at  the rectosigmoid junction.  The distal portion of the specimen was  marked with a suture and sent to pathology.   At this point, I irrigated out the area and noted that hemostasis was  adequate.  I then retrieved the distal ileal stump and removed the  staple line.  I passed a size 25 EEA anvil into the terminal ileum and  then closed the end with GIA stapler.  I then brought the anvil out the  antimesenteric side and secured it with a 2-0 Prolene pursestring  suture.   Following this, the 25 EEA handle was then passed through the anus up to  the rectum and a stapled anastomosis in a Baker type fashion between the  terminal ileum and the rectosigmoid junction was performed.  Two solid  doughnuts were retrieved and the distal doughnut sent for the distal-  most margin.   I then occluded the ileum just proximal to the anastomosis, placed the  anastomosis under water and did an  air leak test which was negative.   Following this, I reinspected the abdomen and noted hemostasis was  adequate.  Abdomen was irrigated.  Irrigation fluid evacuated.   I then asked for needle, sponge and instrument count.  It was reported  to be correct at this time.  Omentum was then placed over the viscera.  I then closed the fascia with a running #1 PDS suture.  The subcutaneous  tissue was irrigated and the skin was closed with staples.   He tolerated procedure without apparent complications and was taken to  the recovery room in satisfactory condition.      [name], M.D.  Electronically Signed     TJR/MEDQ  D:  08/23/2007  T:  08/23/2007  Job:  [id number]   cc:   [name], MD, FACG  [address]  [place]  [place] [address]   Valetta Mole. Swords, MD  [address] [place]  [place] [address]

## 2010-08-11 NOTE — Discharge Summary (Signed)
NAME:  Jeffery [name], Jeffery [name] NO.:  [id number]   MEDICAL RECORD NO.:  [id number]          PATIENT TYPE:  INP   LOCATION:  1527                         FACILITY:  [facility]   PHYSICIAN:  [name], M.D.DATE OF BIRTH:  [birth year]   DATE OF ADMISSION:  08/23/2007  DATE OF DISCHARGE:  08/27/2007                               DISCHARGE SUMMARY   FINAL DIAGNOSIS:  Colonic adenocarcinoma, ascending colon and sigmoid  colon.   SECONDARY DIAGNOSES:  None.   PROCEDURE:  Total abdominal colectomy.   REASON FOR ADMISSION:  This 60 year old male had some blood in his stool  and change in stool caliber.  He underwent a colonoscopy and a lesion in  the ascending colon and sigmoid colon were identified, biopsied and both  positive for adenocarcinoma.  He now presents for the above procedure.   HOSPITAL COURSE:  He was admitted and underwent the total abdominal  colectomy with the anastomosis between the ileum and rectum.  He  actually tolerated this very well.  He began having flatus and bowel  movements by his second postoperative day and his diet was advanced.  His wound looked good.  By his fourth postoperative day he was  tolerating a diet, had good bowel function, the wound looked good, and  he was discharged.   DISPOSITION:  Discharged to home in satisfactory condition Aug 27, 2007.  He will to come to see me in 5-7 days for staple removal.  He is given  Tylox for pain and discharge instructions.      [name], M.D.  Electronically Signed     TJR/MEDQ  D:  09/15/2007  T:  09/15/2007  Job:  [id number]   cc:   [name], MD, FACG  [address]  [place]  [place] [address]   [facility]

## 2010-09-24 ENCOUNTER — Ambulatory Visit (INDEPENDENT_AMBULATORY_CARE_PROVIDER_SITE_OTHER): Payer: 59 | Admitting: Internal Medicine

## 2010-09-24 ENCOUNTER — Encounter: Payer: Self-pay | Admitting: Internal Medicine

## 2010-09-24 VITALS — BP 120/70 | Temp 99.0°F | Wt 192.0 lb

## 2010-09-24 DIAGNOSIS — H60399 Other infective otitis externa, unspecified ear: Secondary | ICD-10-CM

## 2010-09-24 DIAGNOSIS — H6090 Unspecified otitis externa, unspecified ear: Secondary | ICD-10-CM

## 2010-09-24 MED ORDER — NEOMYCIN-POLYMYXIN-HC 3.5-10000-1 OT SOLN: 3.0000 [drp] | Freq: Four times a day (QID) | OTIC | Status: AC

## 2010-09-24 NOTE — Patient Instructions (Signed)
Use eardrops as directed Call or return to clinic prn if these symptoms worsen or fail to improve as anticipated.

## 2010-09-24 NOTE — Progress Notes (Signed)
  Subjective:    Patient ID: Jeffery [name], male    DOB: [birth year], 60 y.o.   MRN: [id number]  HPI  60 year old patient who is seen today in the chief complaint of fullness in mild discomfort involving the right ear. He is felt that he popped and sensation. Denies any fever or drainage.   Review of Systems  HENT: Positive for ear pain. Negative for tinnitus and ear discharge.        Objective:   Physical Exam  Constitutional: He appears well-developed and well-nourished. No distress.  HENT:       The left tympanic membrane and canal normal The right ear canal was swollen and narrowed no significant wax was noted  No periauricular adenopathy          Assessment & Plan:   Otitis externa. Will treat with otic drops and clinically observe. We'll call up there is not prompt clinical improvement

## 2010-12-23 LAB — CROSSMATCH: Antibody Screen: NEGATIVE

## 2010-12-23 LAB — COMPREHENSIVE METABOLIC PANEL
ALT: 35
AST: 35
Albumin: 4.2
CO2: 27
Chloride: 106
Creatinine, Ser: 1.23
GFR calc Af Amer: >60
GFR calc non Af Amer: >60
Potassium: 4.1
Sodium: 142
Total Bilirubin: 0.7

## 2010-12-23 LAB — CBC
MCV: 81.7
MCV: 82.2
Platelets: 208
Platelets: 229
RBC: 4.52
RBC: 4.83
WBC: 5.5
WBC: 9

## 2010-12-23 LAB — DIFFERENTIAL
Basophils Absolute: 0
Eosinophils Absolute: 0.2
Eosinophils Relative: 3
Lymphocytes Relative: 24
Lymphs Abs: 1.3
Monocytes Absolute: 0.5

## 2010-12-23 LAB — BASIC METABOLIC PANEL
BUN: 7
Chloride: 101
Creatinine, Ser: 1.18
GFR calc Af Amer: >60
GFR calc non Af Amer: >60

## 2010-12-23 LAB — ABO/RH: ABO/RH(D): A POS

## 2010-12-23 LAB — PROTIME-INR: Prothrombin Time: 13

## 2011-01-06 ENCOUNTER — Encounter: Payer: Self-pay | Admitting: *Deleted

## 2011-01-25 ENCOUNTER — Encounter: Payer: Self-pay | Admitting: *Deleted

## 2011-02-01 ENCOUNTER — Other Ambulatory Visit: Payer: Self-pay | Admitting: Hematology and Oncology

## 2011-02-01 ENCOUNTER — Other Ambulatory Visit (HOSPITAL_BASED_OUTPATIENT_CLINIC_OR_DEPARTMENT_OTHER): Payer: 59 | Admitting: Lab

## 2011-02-01 ENCOUNTER — Ambulatory Visit (HOSPITAL_COMMUNITY)
Admission: RE | Admit: 2011-02-01 | Discharge: 2011-02-01 | Disposition: A | Discharge status: Home / Self Care | Payer: 59 | Source: Ambulatory Visit | Attending: Hematology and Oncology | Admitting: Hematology and Oncology

## 2011-02-01 DIAGNOSIS — C189 Malignant neoplasm of colon, unspecified: Secondary | ICD-10-CM

## 2011-02-01 DIAGNOSIS — C187 Malignant neoplasm of sigmoid colon: Secondary | ICD-10-CM

## 2011-02-01 DIAGNOSIS — K7689 Other specified diseases of liver: Secondary | ICD-10-CM | POA: Insufficient documentation

## 2011-02-01 DIAGNOSIS — Q619 Cystic kidney disease, unspecified: Secondary | ICD-10-CM | POA: Insufficient documentation

## 2011-02-01 DIAGNOSIS — N200 Calculus of kidney: Secondary | ICD-10-CM | POA: Insufficient documentation

## 2011-02-01 DIAGNOSIS — C19 Malignant neoplasm of rectosigmoid junction: Secondary | ICD-10-CM | POA: Insufficient documentation

## 2011-02-01 DIAGNOSIS — C182 Malignant neoplasm of ascending colon: Secondary | ICD-10-CM

## 2011-02-01 DIAGNOSIS — R911 Solitary pulmonary nodule: Secondary | ICD-10-CM | POA: Insufficient documentation

## 2011-02-01 LAB — CMP (CANCER CENTER ONLY)
ALT(SGPT): 35 U/L (ref 10–47)
CO2: 28 mEq/L (ref 18–33)
Calcium: 9.1 mg/dL (ref 8.0–10.3)
Chloride: 101 mEq/L (ref 98–108)
Creat: 1.1 mg/dl (ref 0.6–1.2)
Glucose, Bld: 103 mg/dL (ref 73–118)
Total Bilirubin: 0.6 mg/dl (ref 0.20–1.60)

## 2011-02-01 LAB — CBC WITH DIFFERENTIAL/PLATELET
BASO%: 0.5 % (ref 0.0–2.0)
Basophils Absolute: 0 10*3/uL (ref 0.0–0.1)
Eosinophils Absolute: 0.2 10*3/uL (ref 0.0–0.5)
HCT: 41.9 % (ref 38.4–49.9)
HGB: 14 g/dL (ref 13.0–17.1)
LYMPH%: 32.9 % (ref 14.0–49.0)
MCHC: 33.5 g/dL (ref 32.0–36.0)
MONO#: 0.7 10*3/uL (ref 0.1–0.9)
NEUT#: 3.6 10*3/uL (ref 1.5–6.5)
NEUT%: 53.8 % (ref 39.0–75.0)
Platelets: 202 10*3/uL (ref 140–400)
WBC: 6.7 10*3/uL (ref 4.0–10.3)
lymph#: 2.2 10*3/uL (ref 0.9–3.3)

## 2011-02-01 MED ORDER — IOHEXOL 300 MG/ML  SOLN
100.0000 mL | Freq: Once | INTRAMUSCULAR | Status: AC | PRN
  Administered 2011-02-01: 100 mL via INTRAVENOUS

## 2011-02-02 LAB — CEA: CEA: 1.1 ng/mL (ref 0.0–5.0)

## 2011-02-03 ENCOUNTER — Ambulatory Visit (HOSPITAL_BASED_OUTPATIENT_CLINIC_OR_DEPARTMENT_OTHER): Payer: 59 | Admitting: Hematology and Oncology

## 2011-02-03 ENCOUNTER — Telehealth: Payer: Self-pay | Admitting: Hematology and Oncology

## 2011-02-03 VITALS — BP 138/78 | HR 66 | Temp 98.0°F | Ht 66.5 in | Wt 197.7 lb

## 2011-02-03 DIAGNOSIS — Z85038 Personal history of other malignant neoplasm of large intestine: Secondary | ICD-10-CM

## 2011-02-03 DIAGNOSIS — C189 Malignant neoplasm of colon, unspecified: Secondary | ICD-10-CM

## 2011-02-03 DIAGNOSIS — C187 Malignant neoplasm of sigmoid colon: Secondary | ICD-10-CM

## 2011-02-03 DIAGNOSIS — R911 Solitary pulmonary nodule: Secondary | ICD-10-CM

## 2011-02-03 NOTE — Telephone Encounter (Signed)
Called Dr Russella Dar office they do not schedule for next year. Pt informed me that he will make the appt.

## 2011-02-03 NOTE — Progress Notes (Signed)
This office note has been dictated.

## 2011-02-03 NOTE — Progress Notes (Signed)
CC:   [name], MD [name], M.D. [name], MD, Clementeen Graham  IDENTIFYING STATEMENT:  The patient is a 60 year old man with colon cancer who presents for followup and to discuss results of recent CT scans.  INTERIM HISTORY:  Mr. Kauffman was last seen 6 months ago and has no current concerns.  He specifically reports a stable weight without abdominal pain, nausea, vomiting, or change in bowel function.  He tells me that he is due for a colonoscopy sometime next year.  We reviewed the results of recent CT scans of the chest, abdomen, and pelvis obtained 02/01/2011.  Chest CT scan documented a stable 5-mm right lower lobe pulmonary nodule.  The abdomen and pelvis showed diffuse hepatic steatosis with no evidence of recurrence.  There was no abdominal or pelvic adenopathy.  MEDICATIONS:  Multivitamins.  ALLERGIES:  None.  PAST MEDICAL HISTORY:  Unremarkable.  FAMILY HISTORY:  Unremarkable.  SOCIAL HISTORY:  Unremarkable.  REVIEW OF SYSTEMS:  Ten-point review of systems negative.  PHYSICAL EXAMINATION:  General:  The patient is a well-appearing, well- nourished man in no distress.  Vitals:  Pulse 66, blood pressure 138/78, temperature 98, respirations 20, weight 197 pounds.  HEENT:  Head is atraumatic, normocephalic.  Sclerae anicteric.  Mouth moist.  Chest: Clear.  CVS:  Unremarkable.  Abdomen:  Soft, nontender.  Bowel sounds present.  Extremities:  No edema.  No evidence of calf tenderness. Lymph Nodes:  No adenopathy.  CNS:  Nonfocal.  LABORATORY DATA:  02/01/2011 white cell count 6.7, hemoglobin 14, hematocrit 41.9, platelets 202.  Sodium 143, potassium 4.3, chloride 101, CO2 of 28, BUN 12, creatinine 1.1, glucose 101, T-bili 0.6, alkaline phosphatase 54, AST 35, ALT 35, calcium 9.1.  CEA 1.1.  Results of CTs as in interval history.  IMPRESSION AND PLAN:  Mr. 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  None of the 22 lymph nodes sampled had evidence of malignancy.  He went on to receive 6 months of adjuvant chemotherapy with XELOX between October 12, 2007, and March 07, 2008. Mr. Hibler continues to demonstrate no evidence of recurrence at this time.  He was thus advised to continue routine followup visits here with plans to return in 9 months' time with blood work, Including CEA level.  In the interim, he is to contact Dr. Ardell Isaacs office for a colonoscopy.  If he has any further questions or concerns, he is not to hesitate to contact us.    ______________________________ Laurice Record, M.D. LIO/MEDQ  D:  02/03/2011  T:  02/03/2011  Job:  [id number]

## 2011-08-26 ENCOUNTER — Encounter: Payer: Self-pay | Admitting: Gastroenterology

## 2011-09-07 ENCOUNTER — Encounter: Payer: Self-pay | Admitting: Gastroenterology

## 2011-10-07 ENCOUNTER — Telehealth: Payer: Self-pay | Admitting: *Deleted

## 2011-10-07 NOTE — Telephone Encounter (Signed)
Spoke with pt and gave pt new date and time for appts on 10/08/11.   Pt voiced understanding.

## 2011-10-08 ENCOUNTER — Other Ambulatory Visit: Payer: 59 | Admitting: Lab

## 2011-10-08 ENCOUNTER — Ambulatory Visit (HOSPITAL_BASED_OUTPATIENT_CLINIC_OR_DEPARTMENT_OTHER): Payer: 59 | Admitting: Hematology and Oncology

## 2011-10-08 ENCOUNTER — Telehealth: Payer: Self-pay | Admitting: Hematology and Oncology

## 2011-10-08 ENCOUNTER — Encounter: Payer: Self-pay | Admitting: Hematology and Oncology

## 2011-10-08 VITALS — BP 151/77 | HR 59 | Temp 99.5°F | Ht 66.5 in | Wt 203.2 lb

## 2011-10-08 DIAGNOSIS — C189 Malignant neoplasm of colon, unspecified: Secondary | ICD-10-CM

## 2011-10-08 DIAGNOSIS — Z85038 Personal history of other malignant neoplasm of large intestine: Secondary | ICD-10-CM

## 2011-10-08 LAB — CBC WITH DIFFERENTIAL/PLATELET
BASO%: 0.6 % (ref 0.0–2.0)
Basophils Absolute: 0 10*3/uL (ref 0.0–0.1)
EOS%: 2.6 % (ref 0.0–7.0)
HGB: 13.5 g/dL (ref 13.0–17.1)
MCH: 27.4 pg (ref 27.2–33.4)
MCHC: 32.8 g/dL (ref 32.0–36.0)
MONO#: 0.6 10*3/uL (ref 0.1–0.9)
RDW: 14.7 % — ABNORMAL HIGH (ref 11.0–14.6)
WBC: 5.8 10*3/uL (ref 4.0–10.3)
lymph#: 1.9 10*3/uL (ref 0.9–3.3)

## 2011-10-08 NOTE — Progress Notes (Signed)
CC:   Jeffery H. Swords, MD [name], M.D. [name], MD, Clementeen Graham  IDENTIFYING STATEMENT:  The patient is a 61 year old man with history of colon, cancer who presents for routine oncology followup.  INTERVAL HISTORY:  Jeffery [name] reports that he has had no major issues or concerns since his last visit in November 2012.  His weight is stable. He denies rectal bleeding.  He denies abdominal pain.  He has excellent energy levels.  He reports that he is due for colonoscopy next month with the [facility].  MEDICATIONS:  Multivitamins.  ALLERGIES:  None.  PAST MEDICAL HISTORY/FAMILY HISTORY/SOCIAL HISTORY:  Unchanged.  REVIEW OF SYSTEMS:  A 10-point review of systems is negative.  PHYSICAL EXAMINATION:  General:  The patient is a well-appearing, well- nourished man in no distress.  Vitals:  Pulse 59, blood pressure 151/77, temperature 99.5, respirations 20, weight 203 pounds.  HEENT:  Head is atraumatic, normocephalic.  Sclerae anicteric.  Mouth moist.  Neck: Supple.  Chest:  Clear.  CVS:  Unremarkable.  Abdomen:  Soft and nontender.  Bowel sounds present.  Extremities:  No edema.  Lymph Nodes: No adenopathy.  CNS:  Nonfocal.  LABORATORY DATA:  On 10/08/2011 white cell count 5.8, hemoglobin 13.5, hematocrit 41.2 platelets 167.  CEA and CMET pending.  IMPRESSION AND PLAN:  Jeffery [name] is a 61 year old man who is status post colectomy on Aug 23, 2007, for 2 synchronous tumors, T4 N0 and T3 N0 adenocarcinoma of the colon.  None of the 22 lymph nodes sampled had evidence of malignancy.  He is status post 6 months of adjuvant chemotherapy with XELOX from October 12, 2007, through March 07, 2008. Exam and history indicate no evidence of recurrence, but CEA level is still pending.  If all is well, he will continue surveillance with plans to follow up in 9 months' time with a CT scan and labs.     ______________________________ Laurice Record, M.D. LIO/MEDQ  D:   10/08/2011  T:  10/08/2011  Job:  [id number]

## 2011-10-08 NOTE — Patient Instructions (Signed)
Jeffery [name]  [id number]  [facility] Discharge Instructions  RECOMMENDATIONS MADE BY THE CONSULTANT AND ANY TEST RESULTS WILL BE SENT TO YOUR REFERRING DOCTOR.   EXAM FINDINGS BY MD TODAY AND SIGNS AND SYMPTOMS TO REPORT TO CLINIC OR PRIMARY MD:   Your current list of medications are: Current Outpatient Prescriptions  Medication Sig Dispense Refill  . ibuprofen (ADVIL,MOTRIN) 200 MG tablet Take 200 mg by mouth as needed.      . Multiple Vitamin (MULTIVITAMIN) tablet Take 1 tablet by mouth daily.           INSTRUCTIONS GIVEN AND DISCUSSED:   SPECIAL INSTRUCTIONS/FOLLOW-UP:  See above.  I acknowledge that I have been informed and understand all the instructions given to me and received a copy. I do not have any more questions at this time, but understand that I may call the [facility] at [phone] during business hours should I have any further questions or need assistance in obtaining follow-up care.

## 2011-10-08 NOTE — Telephone Encounter (Signed)
gve the pt his march 2014 appt calendar along with the ct scan appt °

## 2011-10-08 NOTE — Progress Notes (Signed)
This office note has been dictated.

## 2011-10-12 ENCOUNTER — Ambulatory Visit: Payer: 59 | Admitting: Hematology and Oncology

## 2011-10-12 ENCOUNTER — Other Ambulatory Visit: Payer: 59 | Admitting: Lab

## 2011-11-11 ENCOUNTER — Ambulatory Visit (AMBULATORY_SURGERY_CENTER): Payer: 59 | Admitting: *Deleted

## 2011-11-11 VITALS — Ht 68.0 in | Wt 203.0 lb

## 2011-11-11 DIAGNOSIS — Z9889 Other specified postprocedural states: Secondary | ICD-10-CM

## 2011-11-11 DIAGNOSIS — Z85038 Personal history of other malignant neoplasm of large intestine: Secondary | ICD-10-CM

## 2011-11-25 ENCOUNTER — Encounter: Payer: Self-pay | Admitting: Gastroenterology

## 2011-11-25 ENCOUNTER — Ambulatory Visit (AMBULATORY_SURGERY_CENTER): Payer: 59 | Admitting: Gastroenterology

## 2011-11-25 VITALS — BP 136/84 | HR 57 | Temp 98.4°F | Resp 15 | Ht 68.0 in | Wt 203.0 lb

## 2011-11-25 DIAGNOSIS — Z85038 Personal history of other malignant neoplasm of large intestine: Secondary | ICD-10-CM

## 2011-11-25 DIAGNOSIS — Z1211 Encounter for screening for malignant neoplasm of colon: Secondary | ICD-10-CM

## 2011-11-25 MED ORDER — SODIUM CHLORIDE 0.9 % IV SOLN: 500.0000 mL | INTRAVENOUS | Status: DC

## 2011-11-25 NOTE — Progress Notes (Signed)
Patient did not experience any of the following events: a burn prior to discharge; a fall within the facility; wrong site/side/patient/procedure/implant event; or a hospital transfer or hospital admission upon discharge from the facility. (G8907) Patient did not have preoperative order for IV antibiotic SSI prophylaxis. (G8918)  

## 2011-11-25 NOTE — Patient Instructions (Addendum)
YOU HAD AN ENDOSCOPIC PROCEDURE TODAY AT THE [facility]: Refer to the procedure report that was given to you for any specific questions about what was found during the examination.  If the procedure report does not answer your questions, please call your gastroenterologist to clarify.  If you requested that your care partner not be given the details of your procedure findings, then the procedure report has been included in a sealed envelope for you to review at your convenience later.  YOU SHOULD EXPECT: Some feelings of bloating in the abdomen. Passage of more gas than usual.  Walking can help get rid of the air that was put into your GI tract during the procedure and reduce the bloating. If you had a lower endoscopy (such as a colonoscopy or flexible sigmoidoscopy) you may notice spotting of blood in your stool or on the toilet paper. If you underwent a bowel prep for your procedure, then you may not have a normal bowel movement for a few days.  DIET: Your first meal following the procedure should be a light meal and then it is ok to progress to your normal diet.  A half-sandwich or bowl of soup is an example of a good first meal.  Heavy or fried foods are harder to digest and may make you feel nauseous or bloated.  Likewise meals heavy in dairy and vegetables can cause extra gas to form and this can also increase the bloating.  Drink plenty of fluids but you should avoid alcoholic beverages for 24 hours.  ACTIVITY: Your care partner should take you home directly after the procedure.  You should plan to take it easy, moving slowly for the rest of the day.  You can resume normal activity the day after the procedure however you should NOT DRIVE or use heavy machinery for 24 hours (because of the sedation medicines used during the test).    SYMPTOMS TO REPORT IMMEDIATELY: A gastroenterologist can be reached at any hour.  During normal business hours, 8:30 AM to 5:00 PM Monday through Friday,  call [phone].  After hours and on weekends, please call the GI answering service at [phone] who will take a message and have the physician on call contact you.   Following lower endoscopy (colonoscopy or flexible sigmoidoscopy):  Excessive amounts of blood in the stool  Significant tenderness or worsening of abdominal pains  Swelling of the abdomen that is new, acute  Fever of 100F or higher  FOLLOW UP: If any biopsies were taken you will be contacted by phone or by letter within the next 1-3 weeks.  Call your gastroenterologist if you have not heard about the biopsies in 3 weeks.  Our staff will call the home number listed on your records the next business day following your procedure to check on you and address any questions or concerns that you may have at that time regarding the information given to you following your procedure. This is a courtesy call and so if there is no answer at the home number and we have not heard from you through the emergency physician on call, we will assume that you have returned to your regular daily activities without incident.  SIGNATURES/CONFIDENTIALITY: You and/or your care partner have signed paperwork which will be entered into your electronic medical record.  These signatures attest to the fact that that the information above on your After Visit Summary has been reviewed and is understood.  Full responsibility of the confidentiality of this   discharge information lies with you and/or your care-partner.   Resume medications. Information given on hemorrhoids and high fiber diet with discharge instructions. 

## 2011-11-25 NOTE — Op Note (Signed)
 [facility] [address] [place], [address]   FLEXIBLE SIGMOIDOSCOPY PROCEDURE REPORT  PATIENT: Jeffery [name], Jeffery [name]  MR#: [id number] BIRTHDATE: [birth year] , 61  yrs. old GENDER: Male ENDOSCOPIST: Meryl Dare, MD, [facility]  PROCEDURE DATE:  11/25/2011 PROCEDURE:   Sigmoidoscopy, screening ASA CLASS:   Class II INDICATIONS: high risk patient with personal history of colon cancer: synchronous T4, N0 and T3, N0 tumors in 2009 MEDICATIONS: medications were titrated to patient response per physician's verbal order, Fentanyl 50 mcg IV, Versed 6 mg IV DESCRIPTION OF PROCEDURE:   After the risks benefits and alternatives of the procedure were thoroughly explained, informed consent was obtained. DRE revealed no abnormalities of the rectum. The [id number] endoscope was introduced through the anus and advanced to the surgical anastomosis and then to the distal ileum, without limitations. No adverse events experienced.   The quality of the prep was good .  The instrument was then slowly withdrawn as the mucosa was fully examined.     1.  Normal distal ileum and there was a prior ileocolonic surgical anastomosis in the proximal rectum. The anastomosis appeared normal. 2.  The rectal mucosa was otherwise normal.  Retroflexed views revealed small internal hemorrhoids.  The scope was then withdrawn from the patient and the procedure completed.  COMPLICATIONS: There were no complications.  ENDOSCOPIC IMPRESSION: 1.   Prior ileocolonic surgical anastomosis in the rectum 2.   Small internal hemorrhoids  RECOMMENDATIONS: 1. Flexible sigmoidoscopy in 3 years      eSigned:  Meryl Dare, MD, [facility] 11/25/2011 3:56 PM

## 2011-11-25 NOTE — Progress Notes (Signed)
Pt given sedation and drowsy/sleepy. After procedure started pts eyes open, asked multiple times if having pain and pt denied pain each time. Offered more sedation pt declined. Tolerated procedure well with no problems. Pt resting comfortably after procedure and transferred to recovery. ewm

## 2011-11-26 ENCOUNTER — Telehealth: Payer: Self-pay | Admitting: *Deleted

## 2011-11-26 NOTE — Telephone Encounter (Signed)
No answer at # given.  Left message on voicemail.  

## 2012-04-29 ENCOUNTER — Encounter: Payer: Self-pay | Admitting: Oncology

## 2012-04-29 ENCOUNTER — Telehealth: Payer: Self-pay | Admitting: Oncology

## 2012-04-29 NOTE — Telephone Encounter (Signed)
Talked to patient and gave him appt with Dr.Sherrill, a former patient of Dr. Darol Destine

## 2012-05-29 ENCOUNTER — Other Ambulatory Visit (HOSPITAL_BASED_OUTPATIENT_CLINIC_OR_DEPARTMENT_OTHER): Payer: 59 | Admitting: Lab

## 2012-05-29 ENCOUNTER — Ambulatory Visit (HOSPITAL_COMMUNITY)
Admission: RE | Admit: 2012-05-29 | Discharge: 2012-05-29 | Disposition: A | Discharge status: Home / Self Care | Payer: 59 | Source: Ambulatory Visit | Attending: Hematology and Oncology | Admitting: Hematology and Oncology

## 2012-05-29 ENCOUNTER — Encounter (HOSPITAL_COMMUNITY): Payer: Self-pay

## 2012-05-29 DIAGNOSIS — Z980 Intestinal bypass and anastomosis status: Secondary | ICD-10-CM | POA: Insufficient documentation

## 2012-05-29 DIAGNOSIS — Z9049 Acquired absence of other specified parts of digestive tract: Secondary | ICD-10-CM | POA: Insufficient documentation

## 2012-05-29 DIAGNOSIS — K7689 Other specified diseases of liver: Secondary | ICD-10-CM | POA: Insufficient documentation

## 2012-05-29 DIAGNOSIS — C3490 Malignant neoplasm of unspecified part of unspecified bronchus or lung: Secondary | ICD-10-CM | POA: Insufficient documentation

## 2012-05-29 DIAGNOSIS — C189 Malignant neoplasm of colon, unspecified: Secondary | ICD-10-CM

## 2012-05-29 DIAGNOSIS — Z09 Encounter for follow-up examination after completed treatment for conditions other than malignant neoplasm: Secondary | ICD-10-CM | POA: Insufficient documentation

## 2012-05-29 DIAGNOSIS — N200 Calculus of kidney: Secondary | ICD-10-CM | POA: Insufficient documentation

## 2012-05-29 DIAGNOSIS — N400 Enlarged prostate without lower urinary tract symptoms: Secondary | ICD-10-CM | POA: Insufficient documentation

## 2012-05-29 DIAGNOSIS — M47814 Spondylosis without myelopathy or radiculopathy, thoracic region: Secondary | ICD-10-CM | POA: Insufficient documentation

## 2012-05-29 LAB — COMPREHENSIVE METABOLIC PANEL (CC13)
ALT: 41 U/L (ref 0–55)
Albumin: 4.3 g/dL (ref 3.5–5.0)
Alkaline Phosphatase: 59 U/L (ref 40–150)
Glucose: 122 mg/dl — ABNORMAL HIGH (ref 70–99)
Potassium: 4 mEq/L (ref 3.5–5.1)
Sodium: 142 mEq/L (ref 136–145)
Total Protein: 7.7 g/dL (ref 6.4–8.3)

## 2012-05-29 LAB — CEA: CEA: 1 ng/mL (ref 0.0–5.0)

## 2012-05-29 LAB — CBC WITH DIFFERENTIAL/PLATELET
Basophils Absolute: 0 10*3/uL (ref 0.0–0.1)
Eosinophils Absolute: 0.3 10*3/uL (ref 0.0–0.5)
HCT: 41.6 % (ref 38.4–49.9)
HGB: 13.7 g/dL (ref 13.0–17.1)
NEUT#: 2.7 10*3/uL (ref 1.5–6.5)
RDW: 14.1 % (ref 11.0–14.6)
lymph#: 2.1 10*3/uL (ref 0.9–3.3)

## 2012-05-29 MED ORDER — IOHEXOL 300 MG/ML  SOLN
100.0000 mL | Freq: Once | INTRAMUSCULAR | Status: AC | PRN
  Administered 2012-05-29: 100 mL via INTRAVENOUS

## 2012-05-30 ENCOUNTER — Telehealth: Payer: Self-pay | Admitting: Oncology

## 2012-05-30 ENCOUNTER — Ambulatory Visit (HOSPITAL_BASED_OUTPATIENT_CLINIC_OR_DEPARTMENT_OTHER): Payer: 59 | Admitting: Oncology

## 2012-05-30 VITALS — BP 144/73 | HR 62 | Temp 98.1°F | Resp 20 | Ht 68.0 in | Wt 208.6 lb

## 2012-05-30 DIAGNOSIS — C187 Malignant neoplasm of sigmoid colon: Secondary | ICD-10-CM

## 2012-05-30 NOTE — Progress Notes (Signed)
   [facility]    OFFICE PROGRESS NOTE   INTERVAL HISTORY:   He returns as scheduled. He has been followed by Dr. Dalene Carrow since been diagnosed with colon cancer in May of 2009. He last underwent a sigmoidoscopy by Dr. Russella Dar in August of 2013. He feels well. No complaint. No difficulty with bowel or bladder function. No bleeding.  Objective:  Vital signs in last 24 hours:  Blood pressure 144/73, pulse 62, temperature 98.1 F (36.7 C), temperature source Oral, resp. rate 20, height 5\' 8"  (1.727 m), weight 208 lb 9.6 oz (94.62 kg).    HEENT: Neck without mass Lymphatics: No cervical, supraclavicular, axillary, or inguinal nodes Resp:  Lungs clear bilaterally Cardio: Regular rate and rhythm GI: No hepatosplenomegaly, no mass Vascular: No leg edema   Lab Results:  Lab Results  Component Value Date   WBC 5.8 05/29/2012   HGB 13.7 05/29/2012   HCT 41.6 05/29/2012   MCV 82.2 05/29/2012   PLT 175 05/29/2012   ANC 2.7  CEA 1.0  BUN 13.8, creatinine 1.4  X-rays: CTs of the chest, abdomen, and pelvis on 05/29/2012-stable CT of the chest. No change in small nodules in the right upper lobe and right lower lobe. No focal liver abnormality. No evidence of recurrent tumor.    Medications: I have reviewed the patient's current medications.  Assessment/Plan:  1. Synchronous colon cancers, T4 N0, T3 N0, status post a subtotal colectomy 08/23/2007. He is status post 6 months of adjuvant Xeloda chemotherapy completed in December 2009.  Disposition:  He remains in clinical remission from colon cancer. He is now almost 5 years out from surgery. He has a good prognosis for a long-term disease-free survival. Mr. Mortellaro requested to be discharged from the oncology clinic. He plans to continue endoscopic followup with Dr. Russella Dar and clinical followup with Dr. Cato Mulligan. He has not seen Dr. Cato Mulligan for the past several years. I recommended he schedule an appointment.  Mr. Dini is not  scheduled for a followup appointment in the oncology clinic. I recommend a CEA be obtained at the time of his yearly physical. I will be glad to see him in the future as needed.   Thornton Papas, MD  05/30/2012  1:08 PM

## 2012-05-30 NOTE — Telephone Encounter (Signed)
Per 3/4 pof f/u TBA.

## 2012-05-30 NOTE — Patient Instructions (Signed)
Schedule appointment with Dr. Cato Mulligan

## 2012-06-01 ENCOUNTER — Ambulatory Visit: Payer: 59 | Admitting: Hematology and Oncology

## 2012-08-25 ENCOUNTER — Other Ambulatory Visit: Payer: Self-pay | Admitting: Ophthalmology

## 2012-08-25 NOTE — H&P (Addendum)
Patient Record  Jeffery [name], Jeffery [name]  B. Patient Number:  [id number] Date of Birth:  09-06-1950 Age:  62 years old    Gender:  Male Date of Evaluation:  Aug 25, 2012  Chief Complaint:   The patient is referred for Cataract evaluation. His vision in his right eye is hazy. It bother him with glare when driving into the sun and with car lights at night. He notes his vision is affected for reading. History of Present Illness:   62 yo male c/o blurred vision.  Has difficulty for distance and near.  Has some tearing od, but no pain or discomfort.  No pus or mucus.  Noburning or itching.  Presents for evaluation. Past History:  Allergies:  nkda, Active Medications:   Other Medications:  None. Birth History:  none Past Ocular History:  none Past Medical History:   colon cancer  Past Surgical History:  none Family History:  no amblyopia, no blindness, + cataracts (mother), no crossed eyes, no diabetic retinopathy, no glaucoma, no macular degeneration, no retinal detachment, + cancer (cousin), no diabetes, no heart disease, + high blood pressure (mother, father, cousin, uncle), no stroke Social History:   Smoking Status: never smoker  Alcohol:  none   Driving status:  driving Marital status:  married Review of Systems:   Eyes: + decreased vision  All other systems are negative.  Examination:  Visual Acuity:   Distance [facility] [place]:  OD: 20/70    OS: 20/50 IOP:  OD:  16     OS:  16    @ 04:23PM (Goldmann applanation) Manifest Refraction:    Sphere    Cyl Axis       VA         Add       VA Prism Base R:  +3.00  -4.50  075    20/60       +2.50                      L:  -0.25  -1.25  120    20/30       +2.50                       comments: Give: Od    +0.75 OS     -0.25 - 1.25 x 120                                     ADD + 2.50  Confrontation visual field:  OU:  Normal  Motility:  OU:  Normal  Pupils:  OU:  Shape, size, direct and consensual reaction normal  Adnexa:  Preauricular LN,  lacrimal drainage, lacrimal glands, orbit normal  Eyelids:  Eyelids:  normal Conjunctiva:  OU:  bulbar, palpebral normal  Cornea:  OU:  epithelium, stroma, endothelium, tear film normal  Anterior Chamber:  OU:  depth normal, no cell, no flare, 3+ deep / clear  Iris:  OU:  normal Dilation:  OU: AK-Dilate @ 04:24PM  Lens:  OD:  2+ nuclear sclerotic cataract,  3+ PSC cataract OS:  1+ cortical, 1+ nuclear sclerotic cataract, , 2+ PSC cataract  Vitreous:  OU:  normal  Optic Disc:  OD:  cupping: 0.4  OS:  cupping: 0.45   Macula:  OU:  normal  Vessels:  OU:  normal  Periphery:  OU:  normal  Orientation to person, place and time:  Normal  Mood and affect:  Normal  Impression:  366.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 We discussed the related symptoms, visual significance and when we intervene with surgery. We discussed the surgical techniques used, risks and benefits of surgery.    We discussed his optic nerve cupping and I have recommended baseline VF analysis to rule out low tension Glaucoma.  He indicated understanding our discussion and felt that his questions had been answered to his satisfaction.  He indicates that hee is comfortable with proceding with the recommended treatment/care plan.  Patient Instructions: Please do not eat anything after mignight the day before surgery. Return to clinic:  July 3rd at 4:15 PM for post-operative follow-up  Schedule:  Phacoemulsification, Posterior Chamber Intraocular Lens, OD    (electronically signed)Sladen Plancarte Clarisa Kindred, MD

## 2012-09-19 ENCOUNTER — Encounter (HOSPITAL_COMMUNITY): Payer: Self-pay | Admitting: Pharmacy Technician

## 2012-09-19 NOTE — Pre-Procedure Instructions (Signed)
KAEDYN POLIVKA  09/19/2012   Your procedure is scheduled on:  September 27, 2012  Report to [facility] at 8 AM.  Call this number if you have problems the morning of surgery: [phone]   Remember:   Do not eat food or drink liquids after midnight.   Take these medicines the morning of surgery with A SIP OF WATER: tylenol as needed for pain   Do not wear jewelry, make-up or nail polish.  Do not wear lotions, powders, or perfumes. You may wear deodorant.  Do not shave 48 hours prior to surgery. Men may shave face and neck.  Do not bring valuables to the hospital.  [facility] is not responsible                   for any belongings or valuables.  Contacts, dentures or bridgework may not be worn into surgery.  Leave suitcase in the car. After surgery it may be brought to your room.  For patients admitted to the hospital, checkout time is 11:00 AM the day of  discharge.   Patients discharged the day of surgery will not be allowed to drive  home.  Name and phone number of your driver:   Special Instructions: Shower using CHG 2 nights before surgery and the night before surgery.  If you shower the day of surgery use CHG.  Use special wash - you have one bottle of CHG for all showers.  You should use approximately 1/3 of the bottle for each shower.   Please read over the following fact sheets that you were given: Pain Booklet, Coughing and Deep Breathing and Surgical Site Infection Prevention

## 2012-09-20 ENCOUNTER — Encounter (HOSPITAL_COMMUNITY)
Admission: RE | Admit: 2012-09-20 | Discharge: 2012-09-20 | Disposition: A | Discharge status: Home / Self Care | Payer: 59 | Source: Ambulatory Visit | Attending: Ophthalmology | Admitting: Ophthalmology

## 2012-09-20 LAB — BASIC METABOLIC PANEL
CO2: 23 mEq/L (ref 19–32)
Calcium: 8.9 mg/dL (ref 8.4–10.5)
Creatinine, Ser: 1.19 mg/dL (ref 0.50–1.35)
GFR calc non Af Amer: 63 mL/min — ABNORMAL LOW (ref >90)
Glucose, Bld: 116 mg/dL — ABNORMAL HIGH (ref 70–99)

## 2012-09-20 LAB — CBC
MCH: 26.7 pg (ref 26.0–34.0)
MCV: 83.4 fL (ref 78.0–100.0)
Platelets: 180 10*3/uL (ref 150–400)
RDW: 14 % (ref 11.5–15.5)

## 2012-09-27 ENCOUNTER — Encounter (HOSPITAL_COMMUNITY)
Admission: RE | Disposition: A | Discharge status: Home / Self Care | Payer: Self-pay | Source: Ambulatory Visit | Attending: Ophthalmology

## 2012-09-27 ENCOUNTER — Ambulatory Visit (HOSPITAL_COMMUNITY): Payer: 59 | Admitting: Anesthesiology

## 2012-09-27 ENCOUNTER — Encounter (HOSPITAL_COMMUNITY): Payer: Self-pay | Admitting: *Deleted

## 2012-09-27 ENCOUNTER — Ambulatory Visit (HOSPITAL_COMMUNITY)
Admission: RE | Admit: 2012-09-27 | Discharge: 2012-09-27 | Disposition: A | Discharge status: Home / Self Care | Payer: 59 | Source: Ambulatory Visit | Attending: Ophthalmology | Admitting: Ophthalmology

## 2012-09-27 ENCOUNTER — Encounter (HOSPITAL_COMMUNITY): Payer: Self-pay | Admitting: Anesthesiology

## 2012-09-27 DIAGNOSIS — Z961 Presence of intraocular lens: Secondary | ICD-10-CM | POA: Insufficient documentation

## 2012-09-27 DIAGNOSIS — I10 Essential (primary) hypertension: Secondary | ICD-10-CM | POA: Insufficient documentation

## 2012-09-27 DIAGNOSIS — H2510 Age-related nuclear cataract, unspecified eye: Secondary | ICD-10-CM | POA: Insufficient documentation

## 2012-09-27 DIAGNOSIS — H2589 Other age-related cataract: Secondary | ICD-10-CM | POA: Insufficient documentation

## 2012-09-27 HISTORY — PX: CATARACT EXTRACTION W/PHACO: SHX586

## 2012-09-27 SURGERY — PHACOEMULSIFICATION, CATARACT, WITH IOL INSERTION
Anesthesia: Monitor Anesthesia Care | Site: Eye | Laterality: Right | Wound class: Clean

## 2012-09-27 MED ORDER — ONDANSETRON HCL 4 MG/2ML IJ SOLN: 4.0000 mg | Freq: Once | INTRAMUSCULAR | Status: DC | PRN

## 2012-09-27 MED ORDER — PHENYLEPHRINE HCL 2.5 % OP SOLN
1.0000 [drp] | OPHTHALMIC | Status: AC | PRN
  Administered 2012-09-27 (×3): 1 [drp] via OPHTHALMIC

## 2012-09-27 MED ORDER — ACETAZOLAMIDE SODIUM 500 MG IJ SOLR
INTRAMUSCULAR | Status: AC
  Filled 2012-09-27: qty 500

## 2012-09-27 MED ORDER — NA CHONDROIT SULF-NA HYALURON 40-30 MG/ML IO SOLN
INTRAOCULAR | Status: AC
  Filled 2012-09-27: qty 0.5

## 2012-09-27 MED ORDER — NA CHONDROIT SULF-NA HYALURON 40-30 MG/ML IO SOLN
INTRAOCULAR | Status: DC | PRN
  Administered 2012-09-27: 0.5 mL via INTRAOCULAR

## 2012-09-27 MED ORDER — GATIFLOXACIN 0.5 % OP SOLN
1.0000 [drp] | OPHTHALMIC | Status: AC | PRN
  Administered 2012-09-27 (×3): 1 [drp] via OPHTHALMIC
  Filled 2012-09-27: qty 2.5

## 2012-09-27 MED ORDER — SODIUM HYALURONATE 10 MG/ML IO SOLN
INTRAOCULAR | Status: AC
  Filled 2012-09-27: qty 0.85

## 2012-09-27 MED ORDER — BUPIVACAINE HCL (PF) 0.75 % IJ SOLN
INTRAMUSCULAR | Status: AC
  Filled 2012-09-27: qty 10

## 2012-09-27 MED ORDER — HYPROMELLOSE (GONIOSCOPIC) 2.5 % OP SOLN
OPHTHALMIC | Status: AC
  Filled 2012-09-27: qty 15

## 2012-09-27 MED ORDER — WATER FOR IRRIGATION, STERILE IR SOLN
Status: DC | PRN
  Administered 2012-09-27: 1000 mL

## 2012-09-27 MED ORDER — FENTANYL CITRATE 0.05 MG/ML IJ SOLN
INTRAMUSCULAR | Status: DC | PRN
  Administered 2012-09-27: 75 ug via INTRAVENOUS

## 2012-09-27 MED ORDER — EPINEPHRINE HCL 1 MG/ML IJ SOLN
INTRAMUSCULAR | Status: AC
  Filled 2012-09-27: qty 1

## 2012-09-27 MED ORDER — TRIAMCINOLONE ACETONIDE 40 MG/ML IJ SUSP
INTRAMUSCULAR | Status: AC
  Filled 2012-09-27: qty 5

## 2012-09-27 MED ORDER — DEXAMETHASONE SODIUM PHOSPHATE 10 MG/ML IJ SOLN
INTRAMUSCULAR | Status: AC
  Filled 2012-09-27: qty 1

## 2012-09-27 MED ORDER — BSS IO SOLN
INTRAOCULAR | Status: AC
  Filled 2012-09-27: qty 500

## 2012-09-27 MED ORDER — BACITRACIN-POLYMYXIN B 500-10000 UNIT/GM OP OINT
TOPICAL_OINTMENT | OPHTHALMIC | Status: AC
  Filled 2012-09-27: qty 3.5

## 2012-09-27 MED ORDER — HYPROMELLOSE (GONIOSCOPIC) 2.5 % OP SOLN
OPHTHALMIC | Status: DC | PRN
  Administered 2012-09-27: 2 [drp] via OPHTHALMIC

## 2012-09-27 MED ORDER — DEXAMETHASONE SODIUM PHOSPHATE 10 MG/ML IJ SOLN
INTRAMUSCULAR | Status: DC | PRN
  Administered 2012-09-27: 10 mg via INTRAVENOUS

## 2012-09-27 MED ORDER — ACETAMINOPHEN 10 MG/ML IV SOLN: 1000.0000 mg | Freq: Once | INTRAVENOUS | Status: DC | PRN

## 2012-09-27 MED ORDER — MIDAZOLAM HCL 5 MG/5ML IJ SOLN
INTRAMUSCULAR | Status: DC | PRN
  Administered 2012-09-27: 2 mg via INTRAVENOUS

## 2012-09-27 MED ORDER — PREDNISOLONE ACETATE 1 % OP SUSP
1.0000 [drp] | OPHTHALMIC | Status: AC
  Administered 2012-09-27: 1 [drp] via OPHTHALMIC
  Filled 2012-09-27: qty 5

## 2012-09-27 MED ORDER — SODIUM CHLORIDE 0.9 % IV SOLN
INTRAVENOUS | Status: DC | PRN
  Administered 2012-09-27: 08:00:00 via INTRAVENOUS

## 2012-09-27 MED ORDER — PROPOFOL 10 MG/ML IV BOLUS
INTRAVENOUS | Status: DC | PRN
  Administered 2012-09-27: 30 mg via INTRAVENOUS

## 2012-09-27 MED ORDER — PROVISC 10 MG/ML IO SOLN
INTRAOCULAR | Status: DC | PRN
  Administered 2012-09-27: .85 mL via INTRAOCULAR

## 2012-09-27 MED ORDER — 0.9 % SODIUM CHLORIDE (POUR BTL) OPTIME
TOPICAL | Status: DC | PRN
  Administered 2012-09-27: 1000 mL

## 2012-09-27 MED ORDER — PHENYLEPHRINE HCL 2.5 % OP SOLN
OPHTHALMIC | Status: AC
  Filled 2012-09-27: qty 2

## 2012-09-27 MED ORDER — EPINEPHRINE HCL 1 MG/ML IJ SOLN
INTRAOCULAR | Status: DC | PRN
  Administered 2012-09-27: 09:00:00

## 2012-09-27 MED ORDER — LIDOCAINE HCL (CARDIAC) 20 MG/ML IV SOLN
INTRAVENOUS | Status: DC | PRN
  Administered 2012-09-27: 60 mg via INTRAVENOUS

## 2012-09-27 MED ORDER — CEFAZOLIN SUBCONJUNCTIVAL INJECTION 100 MG/0.5 ML
INJECTION | SUBCONJUNCTIVAL | Status: DC | PRN
  Administered 2012-09-27: 100 mg via SUBCONJUNCTIVAL

## 2012-09-27 MED ORDER — TETRACAINE HCL 0.5 % OP SOLN
2.0000 [drp] | OPHTHALMIC | Status: AC
  Administered 2012-09-27: 2 [drp] via OPHTHALMIC
  Filled 2012-09-27: qty 2

## 2012-09-27 MED ORDER — HYDROMORPHONE HCL PF 1 MG/ML IJ SOLN: 0.2500 mg | INTRAMUSCULAR | Status: DC | PRN

## 2012-09-27 MED ORDER — BACITRACIN-POLYMYXIN B 500-10000 UNIT/GM OP OINT
TOPICAL_OINTMENT | OPHTHALMIC | Status: DC | PRN
  Administered 2012-09-27: 1 via OPHTHALMIC

## 2012-09-27 MED ORDER — LIDOCAINE HCL 2 % IJ SOLN
INTRAMUSCULAR | Status: DC | PRN
  Administered 2012-09-27: 09:00:00 via RETROBULBAR

## 2012-09-27 MED ORDER — CEFAZOLIN SUBCONJUNCTIVAL INJECTION 100 MG/0.5 ML
100.0000 mg | INJECTION | SUBCONJUNCTIVAL | Status: DC
  Filled 2012-09-27: qty 0.5

## 2012-09-27 MED ORDER — LIDOCAINE HCL 2 % IJ SOLN
INTRAMUSCULAR | Status: AC
  Filled 2012-09-27: qty 20

## 2012-09-27 SURGICAL SUPPLY — 46 items
APL SRG 3 HI ABS STRL LF PLS (MISCELLANEOUS) ×1
APPLICATOR COTTON TIP 6IN STRL (MISCELLANEOUS) ×2 IMPLANT
APPLICATOR DR MATTHEWS STRL (MISCELLANEOUS) ×2 IMPLANT
BAG FLD CLT MN 6.25X3.5 (WOUND CARE) ×1
BAG MINI COLL DRAIN (WOUND CARE) ×2 IMPLANT
BLADE KERATOME 2.75 (BLADE) ×2 IMPLANT
CLOTH BEACON ORANGE TIMEOUT ST (SAFETY) ×2 IMPLANT
COVER MAYO STAND STRL (DRAPES) ×1 IMPLANT
DRAPE OPHTHALMIC 77X100 STRL (CUSTOM PROCEDURE TRAY) ×2 IMPLANT
DRAPE POUCH INSTRU U-SHP 10X18 (DRAPES) ×2 IMPLANT
DRSG TEGADERM 4X4.75 (GAUZE/BANDAGES/DRESSINGS) ×2 IMPLANT
GLOVE SS BIOGEL STRL SZ 6.5 (GLOVE) ×1 IMPLANT
GLOVE SUPERSENSE BIOGEL SZ 6.5 (GLOVE) ×1
GLOVE SURG SS PI 6.0 STRL IVOR (GLOVE) ×1 IMPLANT
GOWN SRG XL XLNG 56XLVL 4 (GOWN DISPOSABLE) ×1 IMPLANT
GOWN STRL NON-REIN LRG LVL3 (GOWN DISPOSABLE) ×2 IMPLANT
GOWN STRL NON-REIN XL XLG LVL4 (GOWN DISPOSABLE) ×2
KIT BASIN OR (CUSTOM PROCEDURE TRAY) ×2 IMPLANT
KIT ROOM TURNOVER OR (KITS) ×1 IMPLANT
KNIFE GRIESHABER SHARP 2.5MM (MISCELLANEOUS) ×2 IMPLANT
LENS IOL ACRYSOF MP POST 19.0 (Intraocular Lens) ×1 IMPLANT
MASK EYE SHIELD (GAUZE/BANDAGES/DRESSINGS) ×1 IMPLANT
NDL 18GX1X1/2 (RX/OR ONLY) (NEEDLE) IMPLANT
NDL 25GX 5/8IN NON SAFETY (NEEDLE) ×1 IMPLANT
NDL FILTER BLUNT 18X1 1/2 (NEEDLE) IMPLANT
NDL HYPO 30X.5 LL (NEEDLE) ×2 IMPLANT
NEEDLE 18GX1X1/2 (RX/OR ONLY) (NEEDLE) ×2 IMPLANT
NEEDLE 22X1 1/2 (OR ONLY) (NEEDLE) ×2 IMPLANT
NEEDLE 25GX 5/8IN NON SAFETY (NEEDLE) ×2 IMPLANT
NEEDLE FILTER BLUNT 18X 1/2SAF (NEEDLE) ×1
NEEDLE FILTER BLUNT 18X1 1/2 (NEEDLE) ×1 IMPLANT
NEEDLE HYPO 30X.5 LL (NEEDLE) ×4 IMPLANT
NS IRRIG 1000ML POUR BTL (IV SOLUTION) ×2 IMPLANT
PACK CATARACT CUSTOM (CUSTOM PROCEDURE TRAY) ×2 IMPLANT
PACK CATARACT MCHSCP (PACKS) ×1 IMPLANT
PAD ARMBOARD 7.5X6 YLW CONV (MISCELLANEOUS) ×4 IMPLANT
PAD EYE OVAL STERILE LF (GAUZE/BANDAGES/DRESSINGS) ×1 IMPLANT
SHUTTLE MONARCH TYPE A (NEEDLE) ×2 IMPLANT
SYR TB 1ML LUER SLIP (SYRINGE) ×1 IMPLANT
SYRINGE 10CC LL (SYRINGE) IMPLANT
TAPE PAPER MEDFIX 1IN X 10YD (GAUZE/BANDAGES/DRESSINGS) ×1 IMPLANT
TIP ABS 45DEG FLARED 0.9MM (TIP) ×2 IMPLANT
TOWEL OR 17X24 6PK STRL BLUE (TOWEL DISPOSABLE) ×4 IMPLANT
WATER STERILE IRR 1000ML POUR (IV SOLUTION) ×2 IMPLANT
WIPE INSTRUMENT ADHESIVE BACK (MISCELLANEOUS) ×2 IMPLANT
WIPE INSTRUMENT VISIWIPE 73X73 (MISCELLANEOUS) ×2 IMPLANT

## 2012-09-27 NOTE — Op Note (Signed)
Jeffery [name] 09/27/2012 Cataract: Combined, Nuclear  Procedure: Phacoemulsification, Posterior Chamber Intra-ocular Lens Operative Eye:  right eye  Surgeon: Shade Flood Estimated Blood Loss: minimal Specimens for Pathology:  None Complications: none  The patient was prepared and draped in the usual manner for ocular surgery on the right eye. A Cook lid speculum was placed. A peripheral clear corneal incision was made at the surgical limbus centered at the 11:00 meridian. A separate clear corneal stab incision was made with a 15 degree blade at the 2:00 meridian to permit bi-manual technique. Viscoat and  Provisc as an underlying layer next to the capsule was instilled into the anterior chamber through that incision.  A keratome was used to create a self sealing incision entering the anterior chamber at the 11:00 meridian. A capsulorhexis was performed using a bent 25g needle. The lens was hydrodissected and the nucleus was hydrodilineated using a Nichammin cannula. The Chang chopper was inserted and used to rotate the lens to insure adequate lens mobility. The phacoemulsification handpiece was inserted and a combined phaco-chop technique was employed, fracturing the lens into separate sections with subsequent removal with the phaco handpiece.   The I/A cannula was used to remove remaining lens cortex. Provisc was instilled and used to deepen the anterior chamber and posterior capsule bag. The Monarch injector was used to place a folded Acrysof [id number] PC IOL, + 19.00  diopters, into the capsule bag. A Sinskey lens hook was used to dial in the trailing haptic.  The I/A cannula was used to remove the viscoelastic from the anterior chamber. BSS was used to bring IOP to the desired range and the wound was checked to insure it was watertight. Subconjunctival injections of Ancef 100/0.66ml and Dexamethasone 0.5 ml of a 10mg /6ml solution were placed without complication. The lid speculum and drapes were  removed and the patient's eye was patched with Polymixin/Bacitracin ophthalmic ointment. An eye shield was placed and the patient was transferred alert and conversant from the operating room to the post-operative recovery area.   Shade Flood, MD

## 2012-09-27 NOTE — Anesthesia Preprocedure Evaluation (Addendum)
Anesthesia Evaluation  Patient identified by MRN, date of birth, ID band Patient awake    Reviewed: Allergy & Precautions, H&P , NPO status , Patient's Chart, lab work & pertinent test results, reviewed documented beta blocker date and time   History of Anesthesia Complications Negative for: history of anesthetic complications  Airway Mallampati: II TM Distance: >3 FB Neck ROM: Full    Dental  (+) Teeth Intact and Dental Advisory Given   Pulmonary neg pulmonary ROS,  breath sounds clear to auscultation        Cardiovascular hypertension, Rhythm:Regular Rate:Normal     Neuro/Psych negative neurological ROS  negative psych ROS   GI/Hepatic negative GI ROS, Neg liver ROS,   Endo/Other  negative endocrine ROS  Renal/GU negative Renal ROS     Musculoskeletal negative musculoskeletal ROS (+)   Abdominal   Peds  Hematology negative hematology ROS (+)   Anesthesia Other Findings   Reproductive/Obstetrics negative OB ROS                          Anesthesia Physical Anesthesia Plan  ASA: II  Anesthesia Plan: MAC   Post-op Pain Management:    Induction:   Airway Management Planned: Nasal Cannula and Natural Airway  Additional Equipment:   Intra-op Plan:   Post-operative Plan:   Informed Consent: I have reviewed the patients History and Physical, chart, labs and discussed the procedure including the risks, benefits and alternatives for the proposed anesthesia with the patient or authorized representative who has indicated his/her understanding and acceptance.   Dental advisory given  Plan Discussed with: CRNA and Anesthesiologist  Anesthesia Plan Comments: (R. Cataract H/O Colon Ca no recurrence  Plan MAC  Kipp Brood, MD)        Anesthesia Quick Evaluation

## 2012-09-27 NOTE — Interval H&P Note (Signed)
History and Physical Interval Note:  09/27/2012 8:27 AM  Jeffery [name]  has presented today for surgery, with the diagnosis of Combined Cataract Right Eye  The various methods of treatment have been discussed with the patient and family. After consideration of risks, benefits and other options for treatment, the patient has consented to  Procedure(s): CATARACT EXTRACTION PHACO AND INTRAOCULAR LENS PLACEMENT (IOC) (Right) as a surgical intervention .  The patient's history has been reviewed, patient examined, no change in status, stable for surgery.  I have reviewed the patient's chart and labs.  Questions were answered to the patient's satisfaction.     Sendy Pluta, [name]

## 2012-09-27 NOTE — Transfer of Care (Signed)
Immediate Anesthesia Transfer of Care Note  Patient: Jeffery [name]  Procedure(s) Performed: Procedure(s): CATARACT EXTRACTION PHACO AND INTRAOCULAR LENS PLACEMENT (IOC) (Right)  Patient Location: Short Stay  Anesthesia Type:MAC  Level of Consciousness: awake, alert  and oriented  Airway & Oxygen Therapy: Patient Spontanous Breathing  Post-op Assessment: Report given to PACU RN and Post -op Vital signs reviewed and stable  Post vital signs: Reviewed and stable  Complications: No apparent anesthesia complications

## 2012-09-27 NOTE — Preoperative (Signed)
Beta Blockers   Reason not to administer Beta Blockers:Not Applicable 

## 2012-09-27 NOTE — Anesthesia Postprocedure Evaluation (Signed)
  Anesthesia Post-op Note  Patient: Jeffery [name]  Procedure(s) Performed: Procedure(s): CATARACT EXTRACTION PHACO AND INTRAOCULAR LENS PLACEMENT (IOC) (Right)  Patient Location: Short Stay  Anesthesia Type:MAC  Level of Consciousness: awake, alert  and oriented  Airway and Oxygen Therapy: Patient Spontanous Breathing  Post-op Pain: none  Post-op Assessment: Post-op Vital signs reviewed, Patient's Cardiovascular Status Stable, Patent Airway and Pain level controlled  Post-op Vital Signs: stable  Complications: No apparent anesthesia complications

## 2012-10-03 ENCOUNTER — Encounter (HOSPITAL_COMMUNITY): Payer: Self-pay | Admitting: Ophthalmology

## 2012-10-11 ENCOUNTER — Other Ambulatory Visit: Payer: Self-pay | Admitting: Ophthalmology

## 2012-10-12 ENCOUNTER — Encounter (HOSPITAL_COMMUNITY): Payer: Self-pay | Admitting: Pharmacy Technician

## 2012-10-19 ENCOUNTER — Encounter (HOSPITAL_COMMUNITY): Payer: Self-pay

## 2012-10-19 ENCOUNTER — Encounter (HOSPITAL_COMMUNITY)
Admission: RE | Admit: 2012-10-19 | Discharge: 2012-10-19 | Disposition: A | Discharge status: Home / Self Care | Payer: 59 | Source: Ambulatory Visit | Attending: Ophthalmology | Admitting: Ophthalmology

## 2012-10-19 DIAGNOSIS — Z01812 Encounter for preprocedural laboratory examination: Secondary | ICD-10-CM | POA: Insufficient documentation

## 2012-10-19 DIAGNOSIS — H269 Unspecified cataract: Secondary | ICD-10-CM | POA: Insufficient documentation

## 2012-10-19 LAB — BASIC METABOLIC PANEL
BUN: 11 mg/dL (ref 6–23)
CO2: 25 mEq/L (ref 19–32)
Chloride: 104 mEq/L (ref 96–112)
GFR calc Af Amer: 67 mL/min — ABNORMAL LOW (ref >90)
Potassium: 4.1 mEq/L (ref 3.5–5.1)

## 2012-10-19 LAB — CBC
HCT: 39.9 % (ref 39.0–52.0)
MCV: 83.5 fL (ref 78.0–100.0)
RBC: 4.78 MIL/uL (ref 4.22–5.81)
RDW: 14 % (ref 11.5–15.5)
WBC: 4.9 10*3/uL (ref 4.0–10.5)

## 2012-10-19 NOTE — Pre-Procedure Instructions (Signed)
Jeffery [name]  10/19/2012   Your procedure is scheduled on:  11/01/12  Report to [facility] at 8 AM.  Call this number if you have problems the morning of surgery: [phone]   Remember:   Do not eat food or drink liquids after midnight.   Take these medicines the morning of surgery with A SIP OF WATER: none   Do not wear jewelry, make-up or nail polish.  Do not wear lotions, powders, or perfumes. You may wear deodorant.  Do not shave 48 hours prior to surgery. Men may shave face and neck.  Do not bring valuables to the hospital.  [facility] is not responsible                   for any belongings or valuables.  Contacts, dentures or bridgework may not be worn into surgery.  Leave suitcase in the car. After surgery it may be brought to your room.  For patients admitted to the hospital, checkout time is 11:00 AM the day of  discharge.   Patients discharged the day of surgery will not be allowed to drive  home.  Name and phone number of your driver: none  Special Instructions: Shower using CHG 2 nights before surgery and the night before surgery.  If you shower the day of surgery use CHG.  Use special wash - you have one bottle of CHG for all showers.  You should use approximately 1/3 of the bottle for each shower.   Please read over the following fact sheets that you were given: Pain Booklet, Coughing and Deep Breathing and Surgical Site Infection Prevention

## 2012-10-31 NOTE — Progress Notes (Signed)
Called pt and instructed to arrive at Short Stay at 1030 tomorrow for 1230 surgery with stated understanding.

## 2012-11-01 ENCOUNTER — Encounter (HOSPITAL_COMMUNITY)
Admission: RE | Disposition: A | Discharge status: Home / Self Care | Payer: Self-pay | Source: Ambulatory Visit | Attending: Ophthalmology

## 2012-11-01 ENCOUNTER — Encounter (HOSPITAL_COMMUNITY): Payer: Self-pay | Admitting: Anesthesiology

## 2012-11-01 ENCOUNTER — Ambulatory Visit (HOSPITAL_COMMUNITY): Payer: 59 | Admitting: Anesthesiology

## 2012-11-01 ENCOUNTER — Ambulatory Visit (HOSPITAL_COMMUNITY)
Admission: RE | Admit: 2012-11-01 | Discharge: 2012-11-01 | Disposition: A | Discharge status: Home / Self Care | Payer: 59 | Source: Ambulatory Visit | Attending: Ophthalmology | Admitting: Ophthalmology

## 2012-11-01 DIAGNOSIS — H25049 Posterior subcapsular polar age-related cataract, unspecified eye: Secondary | ICD-10-CM | POA: Insufficient documentation

## 2012-11-01 DIAGNOSIS — Z961 Presence of intraocular lens: Secondary | ICD-10-CM | POA: Insufficient documentation

## 2012-11-01 DIAGNOSIS — H2510 Age-related nuclear cataract, unspecified eye: Secondary | ICD-10-CM | POA: Insufficient documentation

## 2012-11-01 DIAGNOSIS — I10 Essential (primary) hypertension: Secondary | ICD-10-CM | POA: Insufficient documentation

## 2012-11-01 DIAGNOSIS — Z85038 Personal history of other malignant neoplasm of large intestine: Secondary | ICD-10-CM | POA: Insufficient documentation

## 2012-11-01 DIAGNOSIS — H25019 Cortical age-related cataract, unspecified eye: Secondary | ICD-10-CM | POA: Insufficient documentation

## 2012-11-01 DIAGNOSIS — H47239 Glaucomatous optic atrophy, unspecified eye: Secondary | ICD-10-CM | POA: Insufficient documentation

## 2012-11-01 DIAGNOSIS — Z9849 Cataract extraction status, unspecified eye: Secondary | ICD-10-CM | POA: Insufficient documentation

## 2012-11-01 HISTORY — PX: CATARACT EXTRACTION W/PHACO: SHX586

## 2012-11-01 SURGERY — PHACOEMULSIFICATION, CATARACT, WITH IOL INSERTION
Anesthesia: Monitor Anesthesia Care | Site: Eye | Laterality: Left | Wound class: Clean

## 2012-11-01 MED ORDER — PHENYLEPHRINE HCL 2.5 % OP SOLN
1.0000 [drp] | OPHTHALMIC | Status: AC | PRN
  Administered 2012-11-01 (×2): 1 [drp] via OPHTHALMIC

## 2012-11-01 MED ORDER — SODIUM HYALURONATE 10 MG/ML IO SOLN
INTRAOCULAR | Status: AC
  Filled 2012-11-01: qty 0.85

## 2012-11-01 MED ORDER — TETRACAINE HCL 0.5 % OP SOLN
2.0000 [drp] | OPHTHALMIC | Status: AC
  Administered 2012-11-01: 2 [drp] via OPHTHALMIC
  Filled 2012-11-01: qty 2

## 2012-11-01 MED ORDER — PREDNISOLONE ACETATE 1 % OP SUSP
1.0000 [drp] | OPHTHALMIC | Status: AC
  Administered 2012-11-01: 1 [drp] via OPHTHALMIC
  Filled 2012-11-01: qty 5

## 2012-11-01 MED ORDER — LIDOCAINE HCL 2 % IJ SOLN
INTRAMUSCULAR | Status: DC | PRN
  Administered 2012-11-01: 15:00:00 via RETROBULBAR

## 2012-11-01 MED ORDER — CEFAZOLIN SUBCONJUNCTIVAL INJECTION 100 MG/0.5 ML
200.0000 mg | INJECTION | SUBCONJUNCTIVAL | Status: DC
  Filled 2012-11-01: qty 1

## 2012-11-01 MED ORDER — DEXAMETHASONE SODIUM PHOSPHATE 10 MG/ML IJ SOLN
INTRAMUSCULAR | Status: AC
  Filled 2012-11-01: qty 1

## 2012-11-01 MED ORDER — PHENYLEPHRINE HCL 2.5 % OP SOLN
OPHTHALMIC | Status: AC
  Administered 2012-11-01: 1 [drp] via OPHTHALMIC
  Filled 2012-11-01: qty 2

## 2012-11-01 MED ORDER — LIDOCAINE HCL 2 % IJ SOLN
INTRAMUSCULAR | Status: AC
  Filled 2012-11-01: qty 20

## 2012-11-01 MED ORDER — TRIAMCINOLONE ACETONIDE 40 MG/ML IJ SUSP
INTRAMUSCULAR | Status: AC
  Filled 2012-11-01: qty 5

## 2012-11-01 MED ORDER — SODIUM CHLORIDE 0.9 % IV SOLN
INTRAVENOUS | Status: DC | PRN
  Administered 2012-11-01 (×2): via INTRAVENOUS

## 2012-11-01 MED ORDER — PROPOFOL 10 MG/ML IV BOLUS
INTRAVENOUS | Status: DC | PRN
  Administered 2012-11-01: 50 mg via INTRAVENOUS

## 2012-11-01 MED ORDER — CEFAZOLIN SUBCONJUNCTIVAL INJECTION 100 MG/0.5 ML
INJECTION | SUBCONJUNCTIVAL | Status: DC | PRN
  Administered 2012-11-01: 100 mg via SUBCONJUNCTIVAL

## 2012-11-01 MED ORDER — EPINEPHRINE HCL 1 MG/ML IJ SOLN
INTRAOCULAR | Status: DC | PRN
  Administered 2012-11-01: 15:00:00

## 2012-11-01 MED ORDER — BACITRACIN-POLYMYXIN B 500-10000 UNIT/GM OP OINT
TOPICAL_OINTMENT | OPHTHALMIC | Status: DC | PRN
  Administered 2012-11-01: 1 via OPHTHALMIC

## 2012-11-01 MED ORDER — HYPROMELLOSE (GONIOSCOPIC) 2.5 % OP SOLN
OPHTHALMIC | Status: DC | PRN
  Administered 2012-11-01: 2 [drp] via OPHTHALMIC

## 2012-11-01 MED ORDER — 0.9 % SODIUM CHLORIDE (POUR BTL) OPTIME
TOPICAL | Status: DC | PRN
  Administered 2012-11-01: 1000 mL

## 2012-11-01 MED ORDER — BUPIVACAINE HCL (PF) 0.75 % IJ SOLN
INTRAMUSCULAR | Status: AC
  Filled 2012-11-01: qty 10

## 2012-11-01 MED ORDER — PROVISC 10 MG/ML IO SOLN
INTRAOCULAR | Status: DC | PRN
  Administered 2012-11-01: 8.5 mg via INTRAOCULAR

## 2012-11-01 MED ORDER — HYPROMELLOSE (GONIOSCOPIC) 2.5 % OP SOLN
OPHTHALMIC | Status: AC
  Filled 2012-11-01: qty 15

## 2012-11-01 MED ORDER — NA CHONDROIT SULF-NA HYALURON 40-30 MG/ML IO SOLN
INTRAOCULAR | Status: DC | PRN
  Administered 2012-11-01: 0.5 mL via INTRAOCULAR

## 2012-11-01 MED ORDER — FENTANYL CITRATE 0.05 MG/ML IJ SOLN
INTRAMUSCULAR | Status: DC | PRN
  Administered 2012-11-01: 50 ug via INTRAVENOUS

## 2012-11-01 MED ORDER — GATIFLOXACIN 0.5 % OP SOLN
1.0000 [drp] | OPHTHALMIC | Status: AC | PRN
  Administered 2012-11-01 (×3): 1 [drp] via OPHTHALMIC
  Filled 2012-11-01: qty 2.5

## 2012-11-01 MED ORDER — BACITRACIN-POLYMYXIN B 500-10000 UNIT/GM OP OINT
TOPICAL_OINTMENT | OPHTHALMIC | Status: AC
  Filled 2012-11-01: qty 3.5

## 2012-11-01 MED ORDER — EPINEPHRINE HCL 1 MG/ML IJ SOLN
INTRAMUSCULAR | Status: AC
  Filled 2012-11-01: qty 1

## 2012-11-01 MED ORDER — NA CHONDROIT SULF-NA HYALURON 40-30 MG/ML IO SOLN
INTRAOCULAR | Status: AC
  Filled 2012-11-01: qty 0.5

## 2012-11-01 MED ORDER — SODIUM CHLORIDE 0.9 % IV SOLN
INTRAVENOUS | Status: DC
  Administered 2012-11-01: 11:00:00 via INTRAVENOUS

## 2012-11-01 MED ORDER — MIDAZOLAM HCL 5 MG/5ML IJ SOLN
INTRAMUSCULAR | Status: DC | PRN
  Administered 2012-11-01: 2 mg via INTRAVENOUS

## 2012-11-01 MED ORDER — BSS IO SOLN
INTRAOCULAR | Status: AC
  Filled 2012-11-01: qty 500

## 2012-11-01 MED ORDER — DEXAMETHASONE SODIUM PHOSPHATE 10 MG/ML IJ SOLN
INTRAMUSCULAR | Status: DC | PRN
  Administered 2012-11-01: 10 mg

## 2012-11-01 SURGICAL SUPPLY — 44 items
APL SRG 3 HI ABS STRL LF PLS (MISCELLANEOUS) ×1
APPLICATOR COTTON TIP 6IN STRL (MISCELLANEOUS) ×2 IMPLANT
APPLICATOR DR MATTHEWS STRL (MISCELLANEOUS) ×2 IMPLANT
BAG FLD CLT MN 6.25X3.5 (WOUND CARE) ×1
BAG MINI COLL DRAIN (WOUND CARE) ×2 IMPLANT
BLADE KERATOME 2.75 (BLADE) ×2 IMPLANT
CLOTH BEACON ORANGE TIMEOUT ST (SAFETY) ×2 IMPLANT
DRAPE OPHTHALMIC 77X100 STRL (CUSTOM PROCEDURE TRAY) ×2 IMPLANT
DRAPE POUCH INSTRU U-SHP 10X18 (DRAPES) ×2 IMPLANT
DRSG TEGADERM 4X4.75 (GAUZE/BANDAGES/DRESSINGS) ×2 IMPLANT
GLOVE BIOGEL PI IND STRL 6.5 (GLOVE) IMPLANT
GLOVE BIOGEL PI INDICATOR 6.5 (GLOVE) ×1
GLOVE SS BIOGEL STRL SZ 6.5 (GLOVE) ×1 IMPLANT
GLOVE SUPERSENSE BIOGEL SZ 6.5 (GLOVE) ×1
GOWN SRG XL XLNG 56XLVL 4 (GOWN DISPOSABLE) ×1 IMPLANT
GOWN STRL NON-REIN LRG LVL3 (GOWN DISPOSABLE) ×2 IMPLANT
GOWN STRL NON-REIN XL XLG LVL4 (GOWN DISPOSABLE) ×2
KIT BASIN OR (CUSTOM PROCEDURE TRAY) ×2 IMPLANT
KIT ROOM TURNOVER OR (KITS) ×1 IMPLANT
KNIFE GRIESHABER SHARP 2.5MM (MISCELLANEOUS) ×2 IMPLANT
LENS IOL ACRYSOF MP POST 19.5 (Intraocular Lens) ×1 IMPLANT
MASK EYE SHIELD (GAUZE/BANDAGES/DRESSINGS) ×1 IMPLANT
NDL 18GX1X1/2 (RX/OR ONLY) (NEEDLE) IMPLANT
NDL 25GX 5/8IN NON SAFETY (NEEDLE) ×1 IMPLANT
NDL FILTER BLUNT 18X1 1/2 (NEEDLE) IMPLANT
NDL HYPO 30X.5 LL (NEEDLE) ×2 IMPLANT
NEEDLE 18GX1X1/2 (RX/OR ONLY) (NEEDLE) ×2 IMPLANT
NEEDLE 22X1 1/2 (OR ONLY) (NEEDLE) ×2 IMPLANT
NEEDLE 25GX 5/8IN NON SAFETY (NEEDLE) ×2 IMPLANT
NEEDLE FILTER BLUNT 18X 1/2SAF (NEEDLE) ×1
NEEDLE FILTER BLUNT 18X1 1/2 (NEEDLE) ×1 IMPLANT
NEEDLE HYPO 30X.5 LL (NEEDLE) ×4 IMPLANT
NS IRRIG 1000ML POUR BTL (IV SOLUTION) ×2 IMPLANT
PACK CATARACT CUSTOM (CUSTOM PROCEDURE TRAY) ×2 IMPLANT
PAD ARMBOARD 7.5X6 YLW CONV (MISCELLANEOUS) ×3 IMPLANT
PAD EYE OVAL STERILE LF (GAUZE/BANDAGES/DRESSINGS) ×1 IMPLANT
SHUTTLE MONARCH TYPE A (NEEDLE) ×2 IMPLANT
SYR TB 1ML LUER SLIP (SYRINGE) ×1 IMPLANT
TAPE PAPER MEDFIX 1IN X 10YD (GAUZE/BANDAGES/DRESSINGS) ×1 IMPLANT
TIP ABS 45DEG FLARED 0.9MM (TIP) ×2 IMPLANT
TOWEL OR 17X24 6PK STRL BLUE (TOWEL DISPOSABLE) ×4 IMPLANT
WATER STERILE IRR 1000ML POUR (IV SOLUTION) ×2 IMPLANT
WIPE INSTRUMENT ADHESIVE BACK (MISCELLANEOUS) ×2 IMPLANT
WIPE INSTRUMENT VISIWIPE 73X73 (MISCELLANEOUS) ×2 IMPLANT

## 2012-11-01 NOTE — H&P (Signed)
  Patient Record  [name], [name]  Patient Number: [id number]  Date of Birth: 05-Dec-1950  Age: 62 years old Gender: Male  Date of Evaluation: July 24th, 2014 Chief Complaint: The patient is here for evaluation for Cataract surgery Left Eye. His vision in his right eye is much improved post Phaco IOL OD. It bother him with glare when driving into the sun and with car lights at night. He notes his vision is affected for reading.  History of Present Illness: 62 yo male c/o blurred vision. Has difficulty for distance and near. Has some tearing od, but no pain or discomfort. No pus or mucus. Noburning or itching. Presents for evaluation.  Past History:  Allergies: nkda, Active  Medications:  Other Medications: None.  Birth History: none  Past Ocular History: none  Past Medical History:  colon cancer  Past Surgical History: none  Family History: no amblyopia, no blindness, + cataracts (mother), no crossed eyes, no diabetic retinopathy, no glaucoma, no macular degeneration, no retinal detachment, + cancer (cousin), no diabetes, no heart disease, + high blood pressure (mother, father, cousin, uncle), no stroke  Social History:  Smoking Status: never smoker  Alcohol: none  Driving status: driving  Marital status: married  Review of Systems:  Eyes: + decreased vision  All other systems are negative.  Examination:  Visual Acuity:  Distance [place]: OD: 20/25  OS: 20/60  IOP: OD: 16 OS: 16 @ 04:23PM (Goldmann applanation)  Manifest Refraction OS:  Sphere Cyl Axis VA Add VA Prism Base  L: -0.25 -1.25 120 20/40- +2.50  comments: Give:  Od +0.75  OS -0.25 - 1.25 x 120  ADD + 2.50  Confrontation visual field:  OU: Normal  Motility:  OU: Normal  Pupils:  OU: Shape, size, direct and consensual reaction normal  Adnexa:  Preauricular LN, lacrimal drainage, lacrimal glands, orbit normal  Eyelids: Eyelids: normal  Conjunctiva:  OU: bulbar, palpebral normal  Cornea:  OU: epithelium, stroma,  endothelium, tear film normal  Anterior Chamber:  OU: depth normal, no cell, no flare, 3+ deep / clear  Iris:  OU: normal  Dilation: OU: AK-Dilate @ 04:24PM  Lens:  OD: centered PC IOL OD OS: 1+ cortical, 1+ nuclear sclerotic cataract, , 2+ PSC cataract  Vitreous:  OU: normal  Optic Disc:  OD: cupping: 0.4  OS: cupping: 0.45  Macula:  OU: normal  Vessels:  OU: normal  Periphery:  OU: normal  Orientation to person, place and time: Normal  Mood and affect: Normal  Impression:  366.19 Combined Cataract OS, 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 We discussed the related symptoms, visual significance and when we intervene with surgery. We discussed the surgical techniques used, risks and benefits of surgery.  We discussed his optic nerve cupping and I have recommended baseline VF analysis to rule out low tension Glaucoma. He is recovering well post Phaco IOL OD x 09/2012.   He indicated understanding our discussion and felt that his questions had been answered to his satisfaction.  He desires to proceed with Cataract surgery Left Eye.  Patient Instructions: Please do not eat anything after mignight the day before surgery.  Return to clinic: July 3rd at 4:15 PM for post-operative follow-up  Schedule:  Phacoemulsification, Posterior Chamber Intraocular Lens, OS  (electronically signed)Tawney Vanorman Clarisa Kindred, MD

## 2012-11-01 NOTE — Op Note (Signed)
Jeffery [name] 11/01/2012 Cataract: Combined, Nuclear  Procedure: Phacoemulsification, Posterior Chamber Intra-ocular Lens Operative Eye:  left eye  Surgeon: Shade Flood Estimated Blood Loss: minimal Specimens for Pathology:  None Complications: none  The patient was prepared and draped in the usual manner for ocular surgery on the left eye. A Cook lid speculum was placed. A peripheral clear corneal incision was made at the surgical limbus centered at the 11:00 meridian. A separate clear corneal stab incision was made with a 15 degree blade at the 2:00 meridian to permit bi-manual technique. Viscoat and  Provisc as an underlying layer next to the capsule was instilled into the anterior chamber through that incision.  A keratome was used to create a self sealing incision entering the anterior chamber at the 11:00 meridian. A capsulorhexis was performed using a bent 25g needle. The lens was hydrodissected and the nucleus was hydrodilineated using a Nichammin cannula. The Chang chopper was inserted and used to rotate the lens to insure adequate lens mobility. The phacoemulsification handpiece was inserted and a combined phaco-chop technique was employed, fracturing the lens into separate sections with subsequent removal with the phaco handpiece.   The I/A cannula was used to remove remaining lens cortex. Provisc was instilled and used to deepen the anterior chamber and posterior capsule bag. The Monarch injector was used to place a folded Acrysof [id number] PC IOL, + 19.50  diopters, into the capsule bag. A Sinskey lens hook was used to dial in the trailing haptic.  The I/A cannula was used to remove the viscoelastic from the anterior chamber. BSS was used to bring IOP to the desired range and the wound was checked to insure it was watertight. Subconjunctival injections of Ancef 100/0.61ml and Dexamethasone 0.5 ml of a 10mg /63ml solution were placed without complication. The lid speculum and drapes were  removed and the patient's eye was patched with Polymixin/Bacitracin ophthalmic ointment. An eye shield was placed and the patient was transferred alert and conversant from the operating room to the post-operative recovery area.   Shade Flood, MD

## 2012-11-01 NOTE — Transfer of Care (Signed)
Immediate Anesthesia Transfer of Care Note  Patient: Jeffery [name]  Procedure(s) Performed: Procedure(s): CATARACT EXTRACTION PHACO AND INTRAOCULAR LENS PLACEMENT (IOC) (Left)  Patient Location: Short Stay  Anesthesia Type:MAC  Level of Consciousness: awake, alert , oriented and patient cooperative  Airway & Oxygen Therapy: Patient Spontanous Breathing  Post-op Assessment: Report given to PACU RN, Post -op Vital signs reviewed and stable and Patient moving all extremities  Post vital signs: Reviewed and stable  Complications: No apparent anesthesia complications

## 2012-11-01 NOTE — Preoperative (Signed)
Beta Blockers   Reason not to administer Beta Blockers:Not Applicable 

## 2012-11-01 NOTE — Anesthesia Procedure Notes (Signed)
Procedure Name: MAC Date/Time: 11/01/2012 2:24 PM Performed by: Quentin Ore Pre-anesthesia Checklist: Patient identified, Emergency Drugs available, Suction available, Patient being monitored and Timeout performed Patient Re-evaluated:Patient Re-evaluated prior to inductionOxygen Delivery Method: Nasal cannula Intubation Type: IV induction Placement Confirmation: positive ETCO2

## 2012-11-01 NOTE — Anesthesia Postprocedure Evaluation (Signed)
  Anesthesia Post-op Note  Patient: Jeffery [name]  Procedure(s) Performed: Procedure(s): CATARACT EXTRACTION PHACO AND INTRAOCULAR LENS PLACEMENT (IOC) (Left)  Patient Location: PACU  Anesthesia Type:General  Level of Consciousness: awake  Airway and Oxygen Therapy: Patient Spontanous Breathing  Post-op Pain: mild  Post-op Assessment: Post-op Vital signs reviewed  Post-op Vital Signs: Reviewed  Complications: No apparent anesthesia complications

## 2012-11-01 NOTE — Anesthesia Preprocedure Evaluation (Addendum)
Anesthesia Evaluation  Patient identified by MRN, date of birth, ID band  Reviewed: Allergy & Precautions, H&P , NPO status , Patient's Chart, lab work & pertinent test results  Airway Mallampati: I      Dental  (+) Partial Upper   Pulmonary  Discussed past lung hx with patient and wife who was aware of  Past lung findings and will follow up with primary doctor as soon as possible to inquire about follow up. Dr Clarisa Kindred was informed today and will follow up as well with patient. CE breath sounds clear to auscultation        Cardiovascular hypertension, Rhythm:Regular Rate:Normal     Neuro/Psych    GI/Hepatic   Endo/Other    Renal/GU      Musculoskeletal   Abdominal   Peds  Hematology   Anesthesia Other Findings   Reproductive/Obstetrics                        Anesthesia Physical Anesthesia Plan  ASA: II  Anesthesia Plan: MAC   Post-op Pain Management:    Induction: Intravenous  Airway Management Planned: Nasal Cannula  Additional Equipment:   Intra-op Plan:   Post-operative Plan:   Informed Consent: I have reviewed the patients History and Physical, chart, labs and discussed the procedure including the risks, benefits and alternatives for the proposed anesthesia with the patient or authorized representative who has indicated his/her understanding and acceptance.   Dental advisory given  Plan Discussed with: CRNA and Anesthesiologist  Anesthesia Plan Comments:         Anesthesia Quick Evaluation

## 2012-11-01 NOTE — Interval H&P Note (Signed)
History and Physical Interval Note:  11/01/2012 1:50 PM  Jeffery [name]  has presented today for surgery, with the diagnosis of Combined Cataract Left Eye  The various methods of treatment have been discussed with the patient and family. After consideration of risks, benefits and other options for treatment, the patient has consented to  Procedure(s): CATARACT EXTRACTION PHACO AND INTRAOCULAR LENS PLACEMENT (IOC) (Left) as a surgical intervention .  The patient's history has been reviewed, patient examined, no change in status, stable for surgery.  I have reviewed the patient's chart and labs.  Questions were answered to the patient's satisfaction.     Bannon Giammarco, [name]

## 2012-11-02 ENCOUNTER — Encounter (HOSPITAL_COMMUNITY): Payer: Self-pay | Admitting: Ophthalmology

## 2013-03-02 ENCOUNTER — Ambulatory Visit (INDEPENDENT_AMBULATORY_CARE_PROVIDER_SITE_OTHER): Payer: 59 | Admitting: Internal Medicine

## 2013-03-02 ENCOUNTER — Encounter: Payer: Self-pay | Admitting: Internal Medicine

## 2013-03-02 VITALS — BP 168/100 | HR 64 | Temp 99.1°F | Ht 67.5 in | Wt 206.0 lb

## 2013-03-02 DIAGNOSIS — Z85038 Personal history of other malignant neoplasm of large intestine: Secondary | ICD-10-CM

## 2013-03-02 DIAGNOSIS — Z0000 Encounter for general adult medical examination without abnormal findings: Secondary | ICD-10-CM

## 2013-03-02 LAB — HEPATIC FUNCTION PANEL
ALT: 32 U/L (ref 0–53)
AST: 30 U/L (ref 0–37)
Albumin: 4.4 g/dL (ref 3.5–5.2)
Alkaline Phosphatase: 45 U/L (ref 39–117)
Bilirubin, Direct: 0.1 mg/dL (ref 0.0–0.3)
Total Protein: 7.5 g/dL (ref 6.0–8.3)

## 2013-03-02 LAB — POCT URINALYSIS DIPSTICK
Ketones, UA: NEGATIVE
Leukocytes, UA: NEGATIVE
Protein, UA: NEGATIVE
Urobilinogen, UA: 0.2
pH, UA: 5.5

## 2013-03-02 LAB — PSA: PSA: 1.18 ng/mL (ref 0.10–4.00)

## 2013-03-02 LAB — CBC WITH DIFFERENTIAL/PLATELET
Basophils Absolute: 0 10*3/uL (ref 0.0–0.1)
Eosinophils Absolute: 0.2 10*3/uL (ref 0.0–0.7)
Lymphocytes Relative: 32.4 % (ref 12.0–46.0)
MCHC: 33.6 g/dL (ref 30.0–36.0)
Neutro Abs: 2.6 10*3/uL (ref 1.4–7.7)
Neutrophils Relative %: 53.7 % (ref 43.0–77.0)
Platelets: 198 10*3/uL (ref 150.0–400.0)
RDW: 14.2 % (ref 11.5–14.6)

## 2013-03-02 LAB — BASIC METABOLIC PANEL
CO2: 28 mEq/L (ref 19–32)
Chloride: 103 mEq/L (ref 96–112)
Glucose, Bld: 121 mg/dL — ABNORMAL HIGH (ref 70–99)
Sodium: 137 mEq/L (ref 135–145)

## 2013-03-02 LAB — LIPID PANEL
Cholesterol: 219 mg/dL — ABNORMAL HIGH (ref 0–200)
Total CHOL/HDL Ratio: 6

## 2013-03-02 LAB — TSH: TSH: 1.98 u[IU]/mL (ref 0.35–5.50)

## 2013-03-02 NOTE — Progress Notes (Signed)
Pre visit review using our clinic review tool, if applicable. No additional management support is needed unless otherwise documented below in the visit note. 

## 2013-03-04 NOTE — Progress Notes (Signed)
cpx  Past Medical History  Diagnosis Date  . Colon cancer 08/23/2007    chemo    History   Social History  . Marital Status: Married    Spouse Name: N/A    Number of Children: N/A  . Years of Education: N/A   Occupational History  . Not on file.   Social History Main Topics  . Smoking status: Never Smoker   . Smokeless tobacco: Never Used  . Alcohol Use: No  . Drug Use: No  . Sexual Activity: Not on file   Other Topics Concern  . Not on file   Social History Narrative  . No narrative on file    Past Surgical History  Procedure Laterality Date  . Colon surgery  2009  . Cataract extraction w/phaco Right 09/27/2012    Procedure: CATARACT EXTRACTION PHACO AND INTRAOCULAR LENS PLACEMENT (IOC);  Surgeon: Shade Flood, MD;  Location: [facility] OR;  Service: Ophthalmology;  Laterality: Right;  . Cataract extraction w/phaco Left 11/01/2012    Procedure: CATARACT EXTRACTION PHACO AND INTRAOCULAR LENS PLACEMENT (IOC);  Surgeon: Shade Flood, MD;  Location: [facility] OR;  Service: Ophthalmology;  Laterality: Left;    No family history on file.  No Known Allergies  Current Outpatient Prescriptions on File Prior to Visit  Medication Sig Dispense Refill  . acetaminophen (TYLENOL) 500 MG tablet Take 500 mg by mouth every 4 (four) hours as needed for pain (for occasional pain/headache).       . Multiple Vitamin (MULTIVITAMIN) tablet Take 1 tablet by mouth daily.         No current facility-administered medications on file prior to visit.     patient denies chest pain, shortness of breath, orthopnea. Denies lower extremity edema, abdominal pain, change in appetite, change in bowel movements. Patient denies rashes, musculoskeletal complaints. No other specific complaints in a complete review of systems.   BP 168/100  Pulse 64  Temp(Src) 99.1 F (37.3 C) (Oral)  Ht 5' 7.5" (1.715 m)  Wt 206 lb (93.441 kg)  BMI 31.77 kg/m2  well-developed well-nourished male in no acute distress. HEENT exam  atraumatic, normocephalic, neck supple without jugular venous distention. Chest clear to auscultation cardiac exam S1-S2 are regular. Abdominal exam overweight with bowel sounds, soft and nontender. Extremities no edema. Neurologic exam is alert with a normal gait.  Well visit- health maint UTD

## 2013-10-07 IMAGING — CT CT CHEST W/ CM
2 of 5 series · 16 of 46 positions shown, 18 images · IV contrast (omnipaque)
Comparison: 02/01/2011

***END ADDENDUM*** SIGNED BY: Leonela Lippert, M.D.
COMPARISON: 05/29/2012

CT CHEST

***ADDENDUM*** CREATED: 05/30/2012 [DATE]
CLINICAL DATA: Follow up colon cancer

CT CHEST, ABDOMEN AND PELVIS WITH CONTRAST
TECHNIQUE: Multidetector CT imaging of the chest, abdomen and
pelvis was performed following the standard protocol during bolus
administration of intravenous contrast.
Contrast: 100mL OMNIPAQUE IOHEXOL 300 MG/ML  SOLN

[Series 2: cap with st · axial · 0.81mm/px · z∈[-660,-126]mm · 13 of 121 slices shown, 15 images]
[im 7/121  soft-tissue]
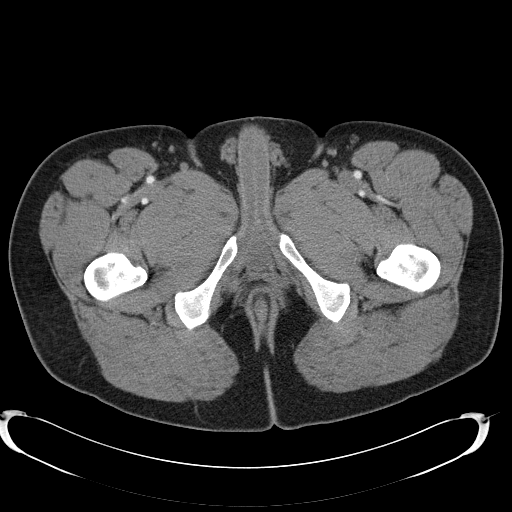
[im 7/121  bone]
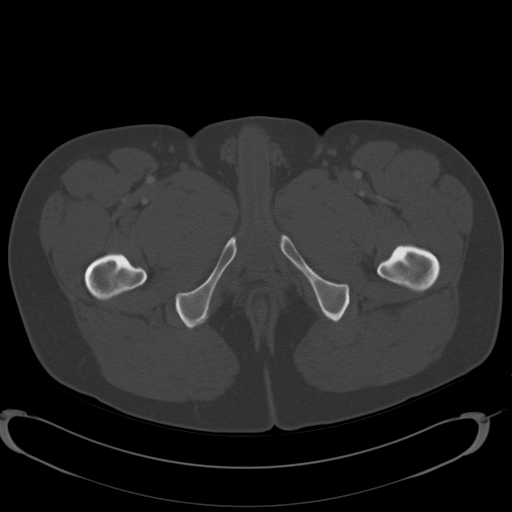
[im 14/121  soft-tissue]
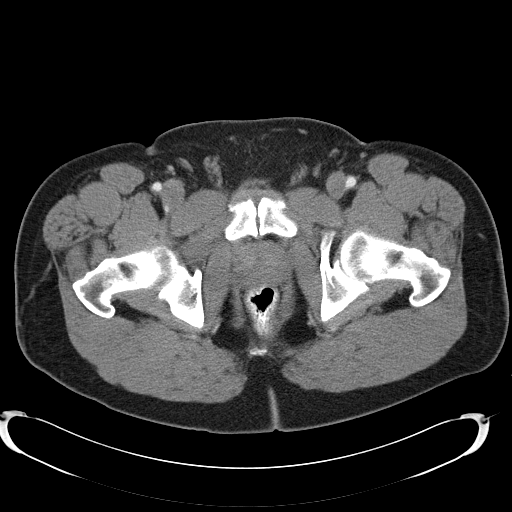
[im 27/121  soft-tissue]
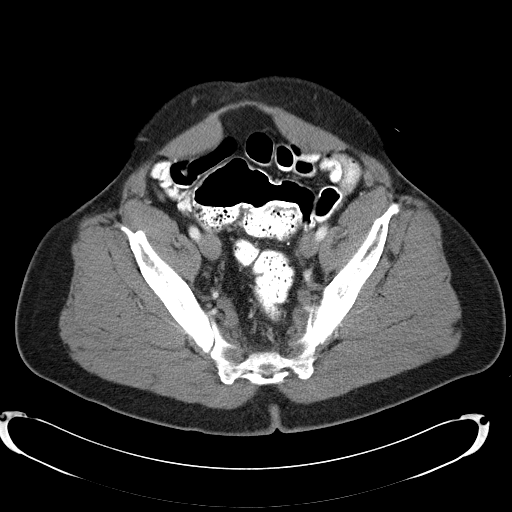
[im 34/121  soft-tissue]
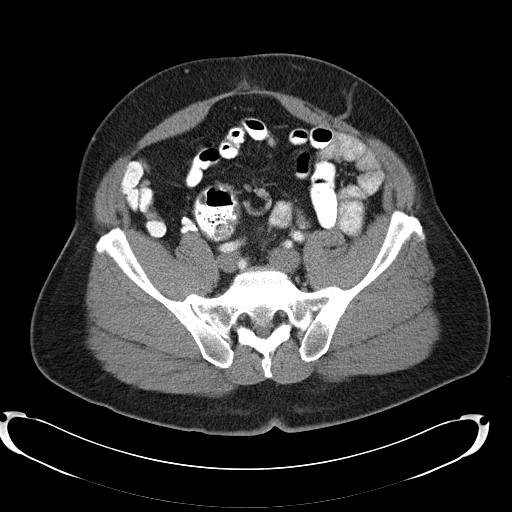
[im 41/121  soft-tissue]
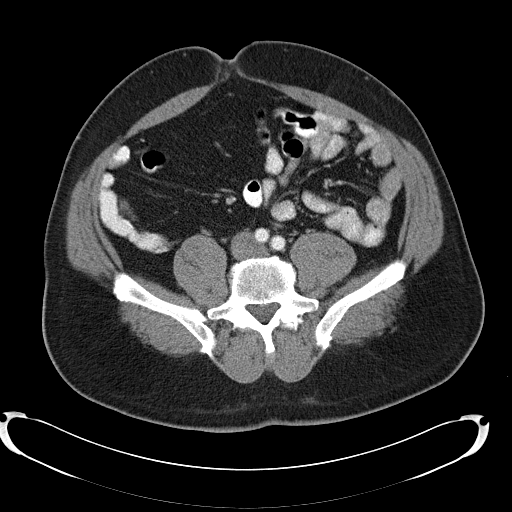
[im 54/121  soft-tissue]
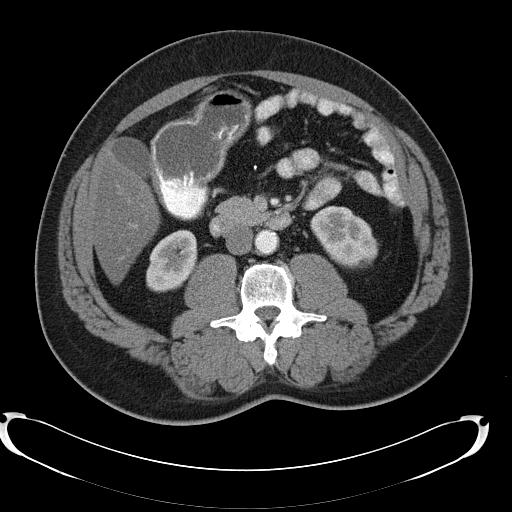
[im 61/121  soft-tissue]
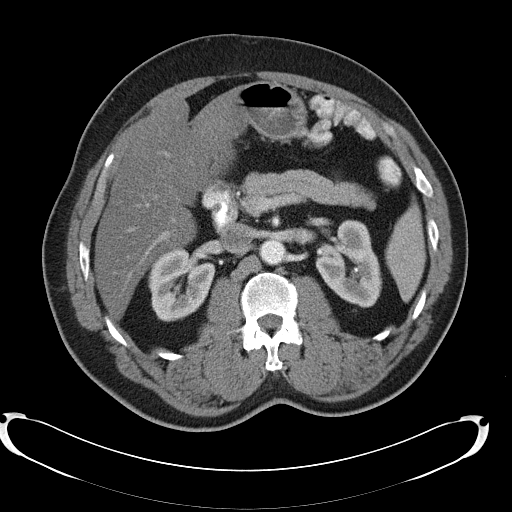
[im 67/121  soft-tissue]
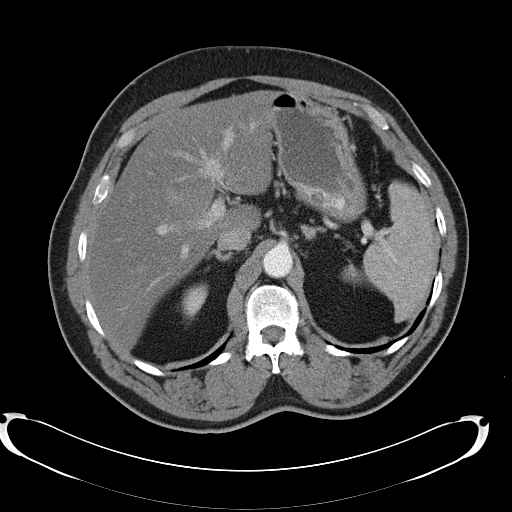
[im 81/121  soft-tissue]
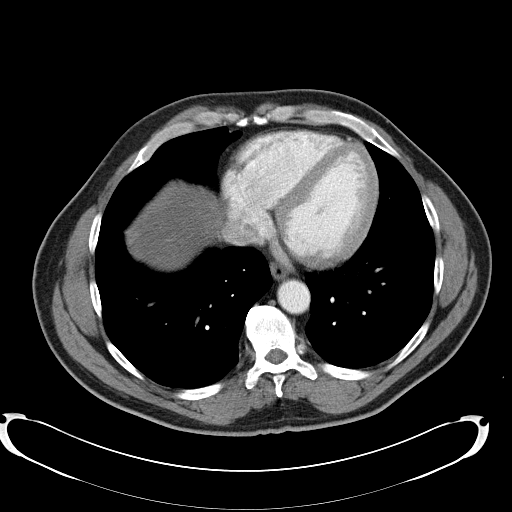
[im 81/121  bone]
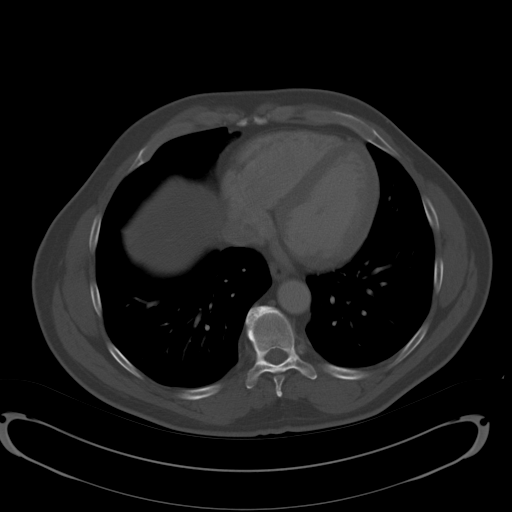
[im 87/121  soft-tissue]
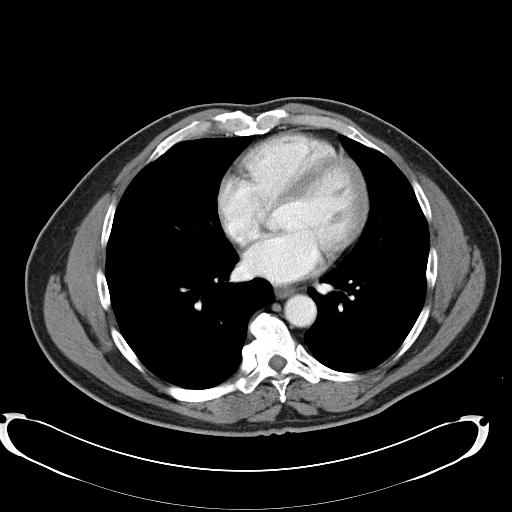
[im 94/121  soft-tissue]
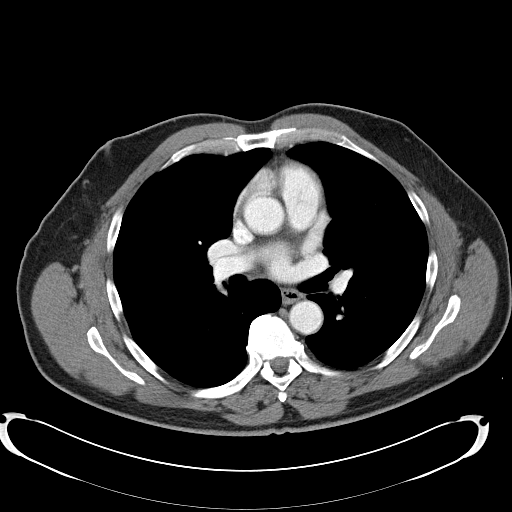
[im 107/121  soft-tissue]
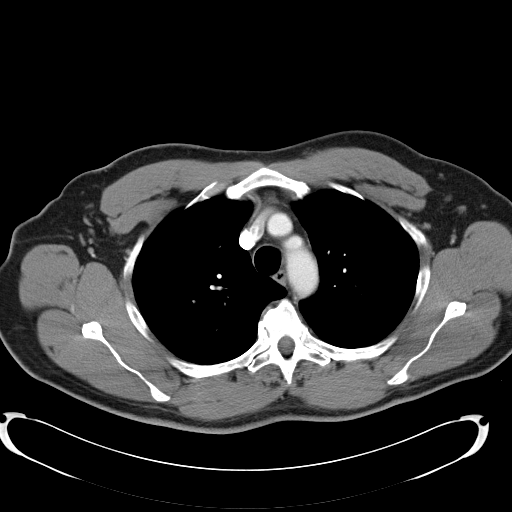
[im 114/121  soft-tissue]
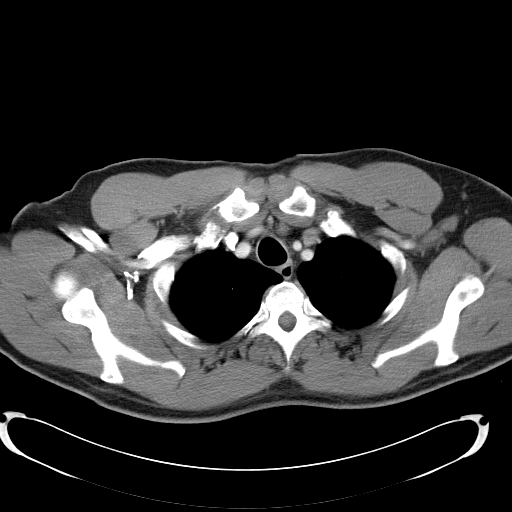

[Series 602: <mpr thick range> · coronal · 1.17mm/px · 3 of 107 slices shown]
[im 36/107  soft-tissue]
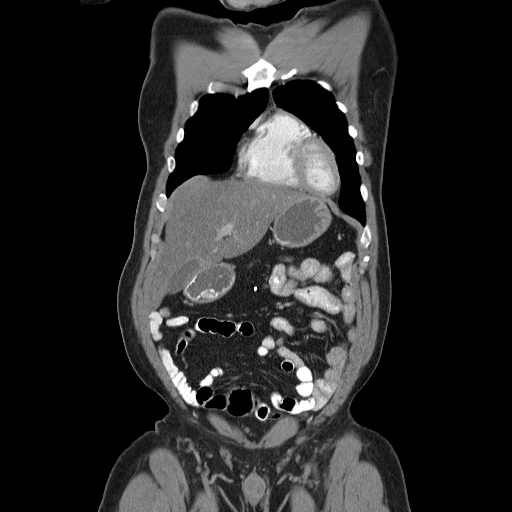
[im 48/107  soft-tissue]
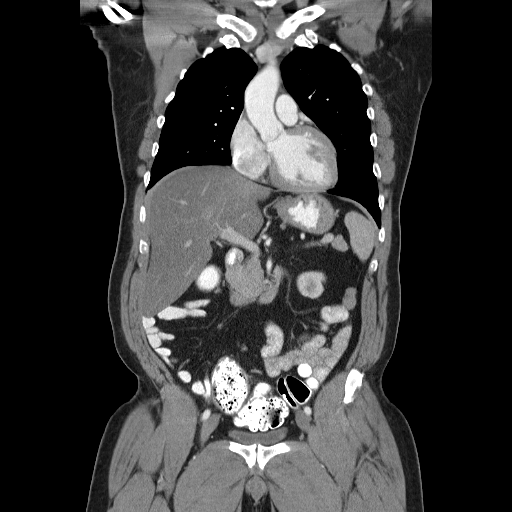
[im 59/107  soft-tissue]
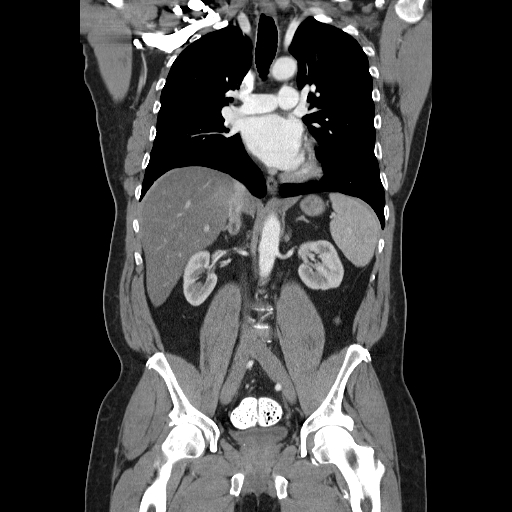

[16 of 46 positions shown; findings below may reference images not displayed]

FINDINGS: Lungs/pleura: There is no pleural effusion identified.
No airspace consolidation or atelectasis noted.

Pulmonary nodule within the right upper lobe measures 6 mm, image
31.  This is unchanged from previous exam.  There is an adjacent
branching nodular density, image 30/series 4.  This is also
unchanged from previous exam and is felt to likely represent an
impacted bronchial.  Along the major fissure of the right lung
there is a subpleural nodule measuring 6 mm, image 35.  Stable from
previous exam.  No new or enlarging pulmonary nodules or masses.

Heart/Mediastinum: The heart size is normal. No pericardial
effusion identified. No enlarged mediastinal or hilar lymph nodes.

Bones/Musculoskeletal:  Spondylosis is noted in the thoracic spine.
No worrisome lytic or sclerotic bone lesions.
IMPRESSION: 1.  Stable CT of the chest.
2.  No change in small nodules within the right upper lobe and
right lower lobe.

CT ABDOMEN AND PELVIS
FINDINGS: Mild diffuse low attenuation throughout the liver
parenchyma is noted.  There is no focal liver abnormality
identified.  Gallbladder is normal.  No biliary dilatation.  The
pancreas is unremarkable.  Normal appearance of the spleen.

Bilateral adrenal glands are normal.  The right kidney is normal.
Nonobstructing stone in the left kidney measures 3 mm.  Hypodensity
within the inferior pole of the left kidney is noted measuring 8
mm, image number 67.  This is too small reliably characterize but
remains unchanged from the previous examination.  The urinary
bladder appears normal.  Prostate gland enlargement is identified.
There is a normal appearance of the seminal vesicles.

No enlarged upper abdominal lymph nodes.  There is no pelvic or
inguinal adenopathy.  No free fluid or fluid collections within the
abdomen or pelvis.  No peritoneal nodule or mass.

The stomach appears normal.  The small bowel loops have a normal
course and caliber.  Subtotal colectomy has been performed with
enterocolonic anastomoses.

Review of the visualized osseous structures is significant for mild
lumbar spondylosis.
IMPRESSION: 1.  No acute findings within the abdomen or pelvis. There are no
specific features identified to suggest residual or recurrence of
tumor.

2.  Hepatic steatosis.
3.  Nephrolithiasis.

## 2014-04-11 DIAGNOSIS — H43813 Vitreous degeneration, bilateral: Secondary | ICD-10-CM | POA: Diagnosis not present

## 2014-09-26 ENCOUNTER — Encounter: Payer: Self-pay | Admitting: Gastroenterology

## 2014-10-22 ENCOUNTER — Encounter: Payer: Self-pay | Admitting: Gastroenterology

## 2014-12-26 ENCOUNTER — Ambulatory Visit (AMBULATORY_SURGERY_CENTER): Payer: Self-pay | Admitting: *Deleted

## 2014-12-26 DIAGNOSIS — Z85038 Personal history of other malignant neoplasm of large intestine: Secondary | ICD-10-CM

## 2014-12-26 NOTE — Progress Notes (Signed)
No egg or soy allergy. No anesthesia problems.  No home o2.  No diet meds.  

## 2015-01-09 ENCOUNTER — Encounter: Payer: Self-pay | Admitting: Gastroenterology

## 2015-01-09 ENCOUNTER — Ambulatory Visit (AMBULATORY_SURGERY_CENTER): Payer: Medicare Other | Admitting: Gastroenterology

## 2015-01-09 VITALS — BP 132/78 | HR 53 | Temp 97.1°F | Resp 19 | Ht 67.0 in | Wt 196.0 lb

## 2015-01-09 DIAGNOSIS — Z85038 Personal history of other malignant neoplasm of large intestine: Secondary | ICD-10-CM | POA: Diagnosis present

## 2015-01-09 HISTORY — PX: SIGMOIDOSCOPY: SUR1295

## 2015-01-09 MED ORDER — SODIUM CHLORIDE 0.9 % IV SOLN: 500.0000 mL | INTRAVENOUS | Status: DC

## 2015-01-09 NOTE — Patient Instructions (Signed)
YOU HAD AN ENDOSCOPIC PROCEDURE TODAY AT [facility]:   Refer to the procedure report that was given to you for any specific questions about what was found during the examination.  If the procedure report does not answer your questions, please call your gastroenterologist to clarify.  If you requested that your care partner not be given the details of your procedure findings, then the procedure report has been included in a sealed envelope for you to review at your convenience later.  YOU SHOULD EXPECT: Some feelings of bloating in the abdomen. Passage of more gas than usual.  Walking can help get rid of the air that was put into your GI tract during the procedure and reduce the bloating. If you had a lower endoscopy (such as a colonoscopy or flexible sigmoidoscopy) you may notice spotting of blood in your stool or on the toilet paper. If you underwent a bowel prep for your procedure, you may not have a normal bowel movement for a few days.  Please Note:  You might notice some irritation and congestion in your nose or some drainage.  This is from the oxygen used during your procedure.  There is no need for concern and it should clear up in a day or so.  SYMPTOMS TO REPORT IMMEDIATELY:   Following lower endoscopy (colonoscopy or flexible sigmoidoscopy):  Excessive amounts of blood in the stool  Significant tenderness or worsening of abdominal pains  Swelling of the abdomen that is new, acute  Fever of 100F or higher  For urgent or emergent issues, a gastroenterologist can be reached at any hour by calling [phone].   DIET: Your first meal following the procedure should be a small meal and then it is ok to progress to your normal diet. Heavy or fried foods are harder to digest and may make you feel nauseous or bloated.  Likewise, meals heavy in dairy and vegetables can increase bloating.  Drink plenty of fluids but you should avoid alcoholic beverages for 24  hours.  ACTIVITY:  You should plan to take it easy for the rest of today and you should NOT DRIVE or use heavy machinery until tomorrow (because of the sedation medicines used during the test).    FOLLOW UP: Our staff will call the number listed on your records the next business day following your procedure to check on you and address any questions or concerns that you may have regarding the information given to you following your procedure. If we do not reach you, we will leave a message.  However, if you are feeling well and you are not experiencing any problems, there is no need to return our call.  We will assume that you have returned to your regular daily activities without incident.  If any biopsies were taken you will be contacted by phone or by letter within the next 1-3 weeks.  Please call us at [phone] if you have not heard about the biopsies in 3 weeks.    SIGNATURES/CONFIDENTIALITY: You and/or your care partner have signed paperwork which will be entered into your electronic medical record.  These signatures attest to the fact that that the information above on your After Visit Summary has been reviewed and is understood.  Full responsibility of the confidentiality of this discharge information lies with you and/or your care-partner.   Resume medications, Information given on hemorrhoids.

## 2015-01-09 NOTE — Progress Notes (Signed)
To recovery, report to Brown, RN, VSS. 

## 2015-01-09 NOTE — Op Note (Signed)
[place]  [address] [place], [address]   FLEXIBLE SIGMOIDOSCOPY PROCEDURE REPORT  PATIENT: Jeffery [name], [place]  MR#: [id number] BIRTHDATE: 1950/10/22 , 1  yrs. old GENDER: male ENDOSCOPIST: Ladene Artist, MD, [facility] PROCEDURE DATE:  01/09/2015 PROCEDURE:   Sigmoidoscopy, surveillance ASA CLASS:   Class II INDICATIONS:high risk personal history of colon cancer. Synchronous T4, N0 & T3, N0 in 2009. MEDICATIONS: Monitored anesthesia care and Propofol 100 mg IV  DESCRIPTION OF PROCEDURE:   After the risks benefits and alternatives of the procedure were thoroughly explained, informed consent was obtained.  Digital exam revealed no abnormalities of the rectum. The LB [id number]  endoscope was introduced through the anus  and advanced to the surgical anastomosis , The exam was Without limitations.    The quality of the prep was The overall prep quality was adequate.  Estimated blood loss is zero unless otherwise noted in this procedure report. The instrument was then slowly withdrawn as the mucosa was fully examined.    COLON FINDINGS: There was evidence of a prior ileocolonic surgical anastomosis in the distal sigmoid colon.   The mucosa in the distal sigmoid colon and rectum was otherwise normal.   Retroflexed views revealed internal Grade I hemorrhoids.   The scope was then withdrawn from the patient and the procedure terminated.  COMPLICATIONS: There were no immediate complications.  ENDOSCOPIC IMPRESSION: 1.   Prior ileocolonic surgical anastomosis in the distal sigmoid colon 2.   Grade I internal hemorrhoids  RECOMMENDATIONS: 1.  Flexible sigmoidoscopy ini 3 years  eSigned:  Ladene Artist, MD, [facility] 01/09/2015 9:32 AM   cc: Kavin Leech, MD

## 2015-01-09 NOTE — Progress Notes (Signed)
Pt. Remained stable during recovery period no s/s of difficulty breathing,denies pain.

## 2015-01-10 ENCOUNTER — Telehealth: Payer: Self-pay

## 2015-01-10 NOTE — Telephone Encounter (Signed)
  Follow up Call-  Call back number 01/09/2015  Post procedure Call Back phone  # [phone]  Permission to leave phone message Yes     Patient questions:  Do you have a fever, pain , or abdominal swelling? No. Pain Score  0 *  Have you tolerated food without any problems? Yes.    Have you been able to return to your normal activities? Yes.    Do you have any questions about your discharge instructions: Diet   No. Medications  No. Follow up visit  No.  Do you have questions or concerns about your Care? No.  Actions: * If pain score is 4 or above: No action needed, pain <4.

## 2015-01-13 ENCOUNTER — Encounter: Payer: Self-pay | Admitting: Family Medicine

## 2015-01-13 ENCOUNTER — Ambulatory Visit (INDEPENDENT_AMBULATORY_CARE_PROVIDER_SITE_OTHER): Payer: Medicare Other | Admitting: Family Medicine

## 2015-01-13 VITALS — BP 142/80 | HR 64 | Temp 98.4°F | Wt 199.0 lb

## 2015-01-13 DIAGNOSIS — R030 Elevated blood-pressure reading, without diagnosis of hypertension: Secondary | ICD-10-CM | POA: Diagnosis not present

## 2015-01-13 DIAGNOSIS — N401 Enlarged prostate with lower urinary tract symptoms: Secondary | ICD-10-CM

## 2015-01-13 DIAGNOSIS — R351 Nocturia: Secondary | ICD-10-CM

## 2015-01-13 DIAGNOSIS — Z0000 Encounter for general adult medical examination without abnormal findings: Secondary | ICD-10-CM

## 2015-01-13 LAB — COMPREHENSIVE METABOLIC PANEL
ALBUMIN: 4.6 g/dL (ref 3.5–5.2)
ALK PHOS: 53 U/L (ref 39–117)
ALT: 17 U/L (ref 0–53)
AST: 23 U/L (ref 0–37)
BILIRUBIN TOTAL: 0.5 mg/dL (ref 0.2–1.2)
BUN: 12 mg/dL (ref 6–23)
CALCIUM: 9.4 mg/dL (ref 8.4–10.5)
CO2: 29 mEq/L (ref 19–32)
CREATININE: 1.36 mg/dL (ref 0.40–1.50)
Chloride: 106 mEq/L (ref 96–112)
GFR: 67.31 mL/min (ref >60.00)
Glucose, Bld: 110 mg/dL — ABNORMAL HIGH (ref 70–99)
Potassium: 4.6 mEq/L (ref 3.5–5.1)
Sodium: 144 mEq/L (ref 135–145)
Total Protein: 7.4 g/dL (ref 6.0–8.3)

## 2015-01-13 LAB — LIPID PANEL
CHOLESTEROL: 222 mg/dL — AB (ref 0–200)
HDL: 38.5 mg/dL — ABNORMAL LOW (ref >39.00)
LDL Cholesterol: 154 mg/dL — ABNORMAL HIGH (ref 0–99)
NonHDL: 183.99
TRIGLYCERIDES: 151 mg/dL — AB (ref 0.0–149.0)
Total CHOL/HDL Ratio: 6
VLDL: 30.2 mg/dL (ref 0.0–40.0)

## 2015-01-13 LAB — CBC
HCT: 40.9 % (ref 39.0–52.0)
HEMOGLOBIN: 13.3 g/dL (ref 13.0–17.0)
MCHC: 32.4 g/dL (ref 30.0–36.0)
MCV: 82.7 fl (ref 78.0–100.0)
PLATELETS: 209 10*3/uL (ref 150.0–400.0)
RBC: 4.95 Mil/uL (ref 4.22–5.81)
RDW: 14.9 % (ref 11.5–15.5)
WBC: 5.6 10*3/uL (ref 4.0–10.5)

## 2015-01-13 LAB — PSA: PSA: 0.75 ng/mL (ref 0.10–4.00)

## 2015-01-13 LAB — TSH: TSH: 1.88 u[IU]/mL (ref 0.35–4.50)

## 2015-01-13 MED ORDER — SILDENAFIL CITRATE 100 MG PO TABS: 50.0000 mg | ORAL_TABLET | Freq: Every day | ORAL | Status: DC | PRN

## 2015-01-13 NOTE — Patient Instructions (Signed)
Sent in viagra- sometimes this is not covered by insurance.   For your knee arthritis- if it starts bothering you more- we can get x-ray or consider injections but I do not think you are close to that currently  Fasting labs before you leave- we will call with results  You declined flu and pneumonia shot  Follow up 1 year for a physical  I would prefer for your blood pressure to be <140/90. Consider checking at home at a pharmacy or [facility]- if it is above goal please come back to see [place] before a year. DASH eating plan can help keep blood pressure low  DASH Eating Plan DASH stands for "Dietary Approaches to Stop Hypertension." The DASH eating plan is a healthy eating plan that has been shown to reduce high blood pressure (hypertension). Additional health benefits may include reducing the risk of type 2 diabetes mellitus, heart disease, and stroke. The DASH eating plan may also help with weight loss. WHAT DO I NEED TO KNOW ABOUT THE DASH EATING PLAN? For the DASH eating plan, you will follow these general guidelines:  Choose foods with a percent daily value for sodium of less than 5% (as listed on the food label).  Use salt-free seasonings or herbs instead of table salt or sea salt.  Check with your health care provider or pharmacist before using salt substitutes.  Eat lower-sodium products, often labeled as "lower sodium" or "no salt added."  Eat fresh foods.  Eat more vegetables, fruits, and low-fat dairy products.  Choose whole grains. Look for the word "whole" as the first word in the ingredient list.  Choose fish and skinless chicken or [place] more often than red meat. Limit fish, poultry, and meat to 6 oz (170 g) each day.  Limit sweets, desserts, sugars, and sugary drinks.  Choose heart-healthy fats.  Limit cheese to 1 oz (28 g) per day.  Eat more home-cooked food and less restaurant, buffet, and fast food.  Limit fried foods.  Cook foods using methods other than  frying.  Limit canned vegetables. If you do use them, rinse them well to decrease the sodium.  When eating at a restaurant, ask that your food be prepared with less salt, or no salt if possible. WHAT FOODS CAN I EAT? Seek help from a dietitian for individual calorie needs. Grains Whole grain or whole wheat bread. Brown rice. Whole grain or whole wheat pasta. Quinoa, bulgur, and whole grain cereals. Low-sodium cereals. Corn or whole wheat flour tortillas. Whole grain cornbread. Whole grain crackers. Low-sodium crackers. Vegetables Fresh or frozen vegetables (raw, steamed, roasted, or grilled). Low-sodium or reduced-sodium tomato and vegetable juices. Low-sodium or reduced-sodium tomato sauce and paste. Low-sodium or reduced-sodium canned vegetables.  Fruits All fresh, canned (in natural juice), or frozen fruits. Meat and Other Protein Products Ground beef (85% or leaner), grass-fed beef, or beef trimmed of fat. Skinless chicken or [place]. Ground chicken or [place]. Pork trimmed of fat. All fish and seafood. Eggs. Dried beans, peas, or lentils. Unsalted nuts and seeds. Unsalted canned beans. Dairy Low-fat dairy products, such as skim or 1% milk, 2% or reduced-fat cheeses, low-fat ricotta or cottage cheese, or plain low-fat yogurt. Low-sodium or reduced-sodium cheeses. Fats and Oils Tub margarines without trans fats. Light or reduced-fat mayonnaise and salad dressings (reduced sodium). Avocado. Safflower, olive, or canola oils. Natural peanut or almond butter. Other Unsalted popcorn and pretzels. The items listed above may not be a complete list of recommended foods or beverages. Contact your  dietitian for more options. WHAT FOODS ARE NOT RECOMMENDED? Grains White bread. White pasta. White rice. Refined cornbread. Bagels and croissants. Crackers that contain trans fat. Vegetables Creamed or fried vegetables. Vegetables in a cheese sauce. Regular canned vegetables. Regular canned tomato sauce  and paste. Regular tomato and vegetable juices. Fruits Dried fruits. Canned fruit in light or heavy syrup. Fruit juice. Meat and Other Protein Products Fatty cuts of meat. Ribs, chicken wings, bacon, sausage, bologna, salami, chitterlings, fatback, hot dogs, bratwurst, and packaged luncheon meats. Salted nuts and seeds. Canned beans with salt. Dairy Whole or 2% milk, cream, half-and-half, and cream cheese. Whole-fat or sweetened yogurt. Full-fat cheeses or blue cheese. Nondairy creamers and whipped toppings. Processed cheese, cheese spreads, or cheese curds. Condiments Onion and garlic salt, seasoned salt, table salt, and sea salt. Canned and packaged gravies. Worcestershire sauce. Tartar sauce. Barbecue sauce. Teriyaki sauce. Soy sauce, including reduced sodium. Steak sauce. Fish sauce. Oyster sauce. Cocktail sauce. Horseradish. Ketchup and mustard. Meat flavorings and tenderizers. Bouillon cubes. Hot sauce. Tabasco sauce. Marinades. Taco seasonings. Relishes. Fats and Oils Butter, stick margarine, lard, shortening, ghee, and bacon fat. Coconut, palm kernel, or palm oils. Regular salad dressings. Other Pickles and olives. Salted popcorn and pretzels. The items listed above may not be a complete list of foods and beverages to avoid. Contact your dietitian for more information. WHERE CAN I FIND MORE INFORMATION? National Heart, Lung, and Blood Institute: travelstabloid.com   This information is not intended to replace advice given to you by your health care provider. Make sure you discuss any questions you have with your health care provider.   Document Released: 03/04/2011 Document Revised: 04/05/2014 Document Reviewed: 01/17/2013 Elsevier Interactive Patient Education [employer].

## 2015-01-13 NOTE — Progress Notes (Signed)
Jeffery Reddish, MD Phone: [phone]  Subjective:  Patient presents today for their annual physical and to establish care. Chief complaint-noted.   -colon cancer survivor doing well. Overall. Used to get samples for ED_ we do not have any to provide today. BP hair high. Reasonable exercise- advised DASH eating plan as well ROS- full  review of systems was completed and negative except for difficulty with erections  The following were reviewed and entered/updated in epic: Past Medical History  Diagnosis Date  . Colon cancer ([place]) 08/23/2007    chemo  . Cataract     cataract extraction   Patient Active Problem List   Diagnosis Date Noted  . History of colon cancer 11/05/2009    Priority: Medium  . ELEVATED BLOOD PRESSURE WITHOUT DIAGNOSIS OF HYPERTENSION 11/24/2006    Priority: Medium  . ERECTILE DYSFUNCTION 11/24/2006   Past Surgical History  Procedure Laterality Date  . Cataract extraction w/phaco Right 09/27/2012    Procedure: CATARACT EXTRACTION PHACO AND INTRAOCULAR LENS PLACEMENT (IOC);  Surgeon: Adonis Brook, MD;  Location: [place];  Service: Ophthalmology;  Laterality: Right;  . Cataract extraction w/phaco Left 11/01/2012    Procedure: CATARACT EXTRACTION PHACO AND INTRAOCULAR LENS PLACEMENT (IOC);  Surgeon: Adonis Brook, MD;  Location: [place];  Service: Ophthalmology;  Laterality: Left;  . Colectomy  2009    colon cancer    Family History  Problem Relation Age of Onset  . Colon cancer Neg Hx   . Esophageal cancer Neg Hx   . Rectal cancer Neg Hx   . Stomach cancer Neg Hx   . Hypertension Mother   . Hypertension Father     Medications- reviewed and updated Current Outpatient Prescriptions  Medication Sig Dispense Refill  . acetaminophen (TYLENOL) 500 MG tablet Take 500 mg by mouth every 4 (four) hours as needed for pain (for occasional pain/headache).     . Multiple Vitamin (MULTIVITAMIN) tablet Take 1 tablet by mouth daily.       No current facility-administered  medications for this visit.    Allergies-reviewed and updated No Known Allergies  Social History   Social History  . Marital Status: Married    Spouse Name: N/A  . Number of Children: N/A  . Years of Education: N/A   Social History Main Topics  . Smoking status: Never Smoker   . Smokeless tobacco: Never Used  . Alcohol Use: No  . Drug Use: No  . Sexual Activity: Not Asked   Other Topics Concern  . None   Social History Narrative   Married. 2 sons.1 grandson.       Retired from Software engineer: bowling, golfing    ROS--See HPI   Objective: BP 142/80 mmHg  Pulse 64  Temp(Src) 98.4 F (36.9 C)  Wt 199 lb (90.266 kg) Gen: NAD, resting comfortably HEENT: Mucous membranes are moist. Oropharynx normal Neck: no thyromegaly CV: RRR no murmurs rubs or gallops Lungs: CTAB no crackles, wheeze, rhonchi Abdomen: soft/nontender/nondistended/normal bowel sounds. No rebound or guarding.  Rectal: normal tone, diffusely mildly enlarged prostate, no masses or tenderness Ext: no edema Skin: warm, dry Neuro: grossly normal, moves all extremities, PERRLA  L Knee: Normal to inspection with no erythema or effusion or obvious bony abnormalities. Palpation normal with no warmth or joint line tenderness or patellar tenderness or condyle tenderness. ROM normal in flexion and extension and lower leg rotation. Ligaments with solid consistent endpoints including ACL, PCL, LCL, MCL. Negative Mcmurray's and provocative  meniscal tests. Non painful patellar compression. Patellar and quadriceps tendons unremarkable. Hamstring and quadriceps strength is normal.   Assessment/Plan:  64 y.o. male presenting for annual physical.  Health Maintenance counseling: 1. Anticipatory guidance: Patient counseled regarding regular dental exams, eye doctor, wearing seatbelts 2. Risk factor reduction:  Advised patient of need for regular exercise and diet rich and fruits and vegetables to  reduce risk of heart attack and stroke. Walks daily with wife.  3. Immunizations/screenings/ancillary studies- declines flu and pneumonia shot 4. Prostate cancer screening- opts in, low risk rectal exam, get PSA. Does have some nocturia and mild BPH  Lab Results  Component Value Date   PSA 1.18 03/02/2013  5. Colon cancer screening - has had cancer, under regular surveilance  L knee arthritis - described as likely cause of pain over 35 years worse over recent years. Reasonable relief with sparing nsaid- will continue prn  1 year CPE or sooner if BP elevated  Orders Placed This Encounter  Procedures  . CBC    [place]  . Comprehensive metabolic panel    [place]    Order Specific Question:  Has the patient fasted?    Answer:  No  . Lipid panel    [place]    Order Specific Question:  Has the patient fasted?    Answer:  No  . TSH    [place]  . PSA    Meds ordered this encounter  Medications  . sildenafil (VIAGRA) 100 MG tablet    Sig: Take 0.5-1 tablets (50-100 mg total) by mouth daily as needed for erectile dysfunction.    Dispense:  5 tablet    Refill:  11

## 2015-04-08 ENCOUNTER — Ambulatory Visit (INDEPENDENT_AMBULATORY_CARE_PROVIDER_SITE_OTHER): Payer: Medicare Other | Admitting: Family Medicine

## 2015-04-08 ENCOUNTER — Encounter: Payer: Self-pay | Admitting: Family Medicine

## 2015-04-08 VITALS — BP 140/82 | HR 71 | Temp 98.4°F | Wt 197.0 lb

## 2015-04-08 DIAGNOSIS — S86012A Strain of left Achilles tendon, initial encounter: Secondary | ICD-10-CM

## 2015-04-08 DIAGNOSIS — E785 Hyperlipidemia, unspecified: Secondary | ICD-10-CM | POA: Insufficient documentation

## 2015-04-08 NOTE — Progress Notes (Signed)
Garret Reddish, MD  Subjective:  Jeffery [name] is a 65 y.o. year old very pleasant male patient who presents for/with See problem oriented charting ROS- No chest pain or shortness of breath with acitivity. No headache or blurry vision.  Does have pain at achilles and difficulty with push off.   Past Medical History-  Patient Active Problem List   Diagnosis Date Noted  . Hyperlipidemia 04/08/2015    Priority: Medium  . History of colon cancer 11/05/2009    Priority: Medium  . ELEVATED BLOOD PRESSURE WITHOUT DIAGNOSIS OF HYPERTENSION 11/24/2006    Priority: Medium  . ERECTILE DYSFUNCTION 11/24/2006    Medications- reviewed and updated Current Outpatient Prescriptions  Medication Sig Dispense Refill  . acetaminophen (TYLENOL) 500 MG tablet Take 500 mg by mouth every 4 (four) hours as needed for pain (for occasional pain/headache).     . Multiple Vitamin (MULTIVITAMIN) tablet Take 1 tablet by mouth daily.      . sildenafil (VIAGRA) 100 MG tablet Take 0.5-1 tablets (50-100 mg total) by mouth daily as needed for erectile dysfunction. 5 tablet 11   No current facility-administered medications for this visit.    Objective: BP 140/82 mmHg  Pulse 71  Temp(Src) 98.4 F (36.9 C)  Wt 197 lb (89.359 kg) Gen: NAD, resting comfortably CV: RRR  Lungs: CTAB nonlabored Abdomen: soft/nontender/nondistended/normal bowel sounds. Ext: no pretibial edema- just around achilles Skin: warm, dry Neuro: grossly normal, moves all extremities, intact distal sensation  MSK: see below  Assessment/Plan:  Achilles tendon tear, left, initial encounter - S: Playing basketball Wednesday after Christmas (about 2 weeks ago). Felt like someone kicked him in his left achilles and felt immediate pain. Tried to "shake it off" but could not and had to stop playing. Since that time he has noted pain with walking unless he walks completely flat footed. He cannot stand on his toes on the left if using both feet for  more than a second and uses right foot mainly to help him lift off. Cannot do this at all on left. Has noted swelling near achilles tendon. Largely no pain at rest. Pain not worsening.  [place] squeeze test on left produces minimal plantar flexion as compred to the right. Significant swelling around left achilles tendon above insertion point on left with none on right. Some pain with palpation of area A/P: Plan: AMB referral to orthopedics. Dr. Paulla Fore of [place] [facility] if available. I am suspicious of significant achilles tendon tear. Ask for their expertise on surgical vs. nonsurgical management as well as consideration u/s.   Return precautions advised.   Orders Placed This Encounter  Procedures  . AMB referral to orthopedics    Referral Priority:  Urgent    Referral Type:  Consultation    Number of Visits Requested:  1   If patient were to need surgery, would consider placing him on low dose amlodipine short term for recently more elevated blood pressures (some of elevated Bp today may have been due to some pain). He is able to complete 4 METS without difficulty at baseline and would not advise further workup from cardiac perspective. With 13.5% 10 year heart attack/CVA risk I would also advise statin at least perioperatively (2 weeks before to 2 weeks after). He has declined statin otherwise.  BP Readings from Last 3 Encounters:  04/08/15 140/82  01/13/15 142/80  01/09/15 132/78

## 2015-04-08 NOTE — Patient Instructions (Addendum)
I am worried about a significant achilles tendon tear. Referring you to orthopedics and asking to be seen before end of the week. Expect a call before end of the day today. Trying to get you in with Dr. Paulla Fore - sports medicine doctor within orthopedic group, but if he cannot get you in will see another orthopedic group

## 2015-04-10 DIAGNOSIS — M79672 Pain in left foot: Secondary | ICD-10-CM | POA: Diagnosis not present

## 2015-04-10 DIAGNOSIS — S86012A Strain of left Achilles tendon, initial encounter: Secondary | ICD-10-CM | POA: Diagnosis not present

## 2015-04-17 DIAGNOSIS — Z961 Presence of intraocular lens: Secondary | ICD-10-CM | POA: Diagnosis not present

## 2015-04-17 DIAGNOSIS — H43813 Vitreous degeneration, bilateral: Secondary | ICD-10-CM | POA: Diagnosis not present

## 2015-04-24 DIAGNOSIS — S86012D Strain of left Achilles tendon, subsequent encounter: Secondary | ICD-10-CM | POA: Diagnosis not present

## 2015-05-08 DIAGNOSIS — S86012D Strain of left Achilles tendon, subsequent encounter: Secondary | ICD-10-CM | POA: Diagnosis not present

## 2015-05-22 DIAGNOSIS — S86012D Strain of left Achilles tendon, subsequent encounter: Secondary | ICD-10-CM | POA: Diagnosis not present

## 2015-05-22 DIAGNOSIS — M79672 Pain in left foot: Secondary | ICD-10-CM | POA: Diagnosis not present

## 2015-06-05 DIAGNOSIS — S86012D Strain of left Achilles tendon, subsequent encounter: Secondary | ICD-10-CM | POA: Diagnosis not present

## 2015-07-03 DIAGNOSIS — M79672 Pain in left foot: Secondary | ICD-10-CM | POA: Diagnosis not present

## 2015-07-03 DIAGNOSIS — S86012D Strain of left Achilles tendon, subsequent encounter: Secondary | ICD-10-CM | POA: Diagnosis not present

## 2015-08-14 DIAGNOSIS — S86012D Strain of left Achilles tendon, subsequent encounter: Secondary | ICD-10-CM | POA: Diagnosis not present

## 2015-08-14 DIAGNOSIS — M25562 Pain in left knee: Secondary | ICD-10-CM | POA: Diagnosis not present

## 2015-09-25 DIAGNOSIS — M25562 Pain in left knee: Secondary | ICD-10-CM | POA: Diagnosis not present

## 2015-09-25 DIAGNOSIS — S86012D Strain of left Achilles tendon, subsequent encounter: Secondary | ICD-10-CM | POA: Diagnosis not present

## 2016-04-22 DIAGNOSIS — H43813 Vitreous degeneration, bilateral: Secondary | ICD-10-CM | POA: Diagnosis not present

## 2016-04-22 DIAGNOSIS — Z961 Presence of intraocular lens: Secondary | ICD-10-CM | POA: Diagnosis not present

## 2016-06-01 ENCOUNTER — Encounter: Payer: Self-pay | Admitting: Family Medicine

## 2016-06-01 ENCOUNTER — Ambulatory Visit (INDEPENDENT_AMBULATORY_CARE_PROVIDER_SITE_OTHER): Payer: Medicare Other | Admitting: Family Medicine

## 2016-06-01 VITALS — BP 144/82 | HR 59 | Temp 98.3°F | Ht 67.0 in | Wt 200.2 lb

## 2016-06-01 DIAGNOSIS — Z125 Encounter for screening for malignant neoplasm of prostate: Secondary | ICD-10-CM

## 2016-06-01 DIAGNOSIS — Z0000 Encounter for general adult medical examination without abnormal findings: Secondary | ICD-10-CM

## 2016-06-01 DIAGNOSIS — E785 Hyperlipidemia, unspecified: Secondary | ICD-10-CM | POA: Diagnosis not present

## 2016-06-01 DIAGNOSIS — I10 Essential (primary) hypertension: Secondary | ICD-10-CM | POA: Diagnosis not present

## 2016-06-01 LAB — CBC WITH DIFFERENTIAL/PLATELET
BASOS ABS: 0 10*3/uL (ref 0.0–0.1)
BASOS PCT: 0.5 % (ref 0.0–3.0)
EOS ABS: 0.3 10*3/uL (ref 0.0–0.7)
Eosinophils Relative: 4.7 % (ref 0.0–5.0)
HEMATOCRIT: 42.9 % (ref 39.0–52.0)
Hemoglobin: 13.8 g/dL (ref 13.0–17.0)
LYMPHS ABS: 1.8 10*3/uL (ref 0.7–4.0)
LYMPHS PCT: 33.2 % (ref 12.0–46.0)
MCHC: 32.2 g/dL (ref 30.0–36.0)
MCV: 82.4 fl (ref 78.0–100.0)
MONO ABS: 0.6 10*3/uL (ref 0.1–1.0)
Monocytes Relative: 10.8 % (ref 3.0–12.0)
NEUTROS ABS: 2.8 10*3/uL (ref 1.4–7.7)
NEUTROS PCT: 50.8 % (ref 43.0–77.0)
PLATELETS: 192 10*3/uL (ref 150.0–400.0)
RBC: 5.21 Mil/uL (ref 4.22–5.81)
RDW: 14.2 % (ref 11.5–15.5)
WBC: 5.5 10*3/uL (ref 4.0–10.5)

## 2016-06-01 LAB — COMPREHENSIVE METABOLIC PANEL
ALT: 20 U/L (ref 0–53)
AST: 28 U/L (ref 0–37)
Albumin: 4.8 g/dL (ref 3.5–5.2)
Alkaline Phosphatase: 49 U/L (ref 39–117)
BUN: 13 mg/dL (ref 6–23)
CALCIUM: 9.4 mg/dL (ref 8.4–10.5)
CHLORIDE: 103 meq/L (ref 96–112)
CO2: 31 meq/L (ref 19–32)
CREATININE: 1.26 mg/dL (ref 0.40–1.50)
GFR: 73.2 mL/min (ref >60.00)
GLUCOSE: 91 mg/dL (ref 70–99)
Potassium: 4.5 mEq/L (ref 3.5–5.1)
Sodium: 140 mEq/L (ref 135–145)
Total Bilirubin: 0.7 mg/dL (ref 0.2–1.2)
Total Protein: 7.8 g/dL (ref 6.0–8.3)

## 2016-06-01 LAB — LDL CHOLESTEROL, DIRECT: Direct LDL: 100 mg/dL

## 2016-06-01 LAB — LIPID PANEL
CHOL/HDL RATIO: 6
Cholesterol: 203 mg/dL — ABNORMAL HIGH (ref 0–200)
HDL: 35.9 mg/dL — AB (ref >39.00)
NONHDL: 167.12
TRIGLYCERIDES: 276 mg/dL — AB (ref 0.0–149.0)
VLDL: 55.2 mg/dL — ABNORMAL HIGH (ref 0.0–40.0)

## 2016-06-01 LAB — PSA: PSA: 1.11 ng/mL (ref 0.10–4.00)

## 2016-06-01 NOTE — Progress Notes (Signed)
Pre visit review using our clinic review tool, if applicable. No additional management support is needed unless otherwise documented below in the visit note. 

## 2016-06-01 NOTE — Progress Notes (Signed)
Phone: [phone]  Subjective:  Patient presents today for their annual physical. Chief complaint-noted.   See problem oriented charting- ROS- full  review of systems was completed and negative including No chest pain or shortness of breath. No headache or blurry vision.   The following were reviewed and entered/updated in epic: Past Medical History:  Diagnosis Date  . Cataract    cataract extraction  . Colon cancer ([place]) 08/23/2007   chemo   Patient Active Problem List   Diagnosis Date Noted  . Hyperlipidemia 04/08/2015    Priority: Medium  . History of colon cancer 11/05/2009    Priority: Medium  . Hypertension 11/24/2006    Priority: Medium  . ERECTILE DYSFUNCTION 11/24/2006   Past Surgical History:  Procedure Laterality Date  . CATARACT EXTRACTION W/PHACO Right 09/27/2012   Procedure: CATARACT EXTRACTION PHACO AND INTRAOCULAR LENS PLACEMENT (IOC);  Surgeon: Adonis Brook, MD;  Location: [facility];  Service: Ophthalmology;  Laterality: Right;  . CATARACT EXTRACTION W/PHACO Left 11/01/2012   Procedure: CATARACT EXTRACTION PHACO AND INTRAOCULAR LENS PLACEMENT (IOC);  Surgeon: Adonis Brook, MD;  Location: [facility];  Service: Ophthalmology;  Laterality: Left;  . COLECTOMY  2009   colon cancer    Family History  Problem Relation Age of Onset  . Colon cancer Neg Hx   . Esophageal cancer Neg Hx   . Rectal cancer Neg Hx   . Stomach cancer Neg Hx   . Hypertension Mother   . Hypertension Father     Medications- reviewed and updated Current Outpatient Prescriptions  Medication Sig Dispense Refill  . acetaminophen (TYLENOL) 500 MG tablet Take 500 mg by mouth every 4 (four) hours as needed for pain (for occasional pain/headache).     . Multiple Vitamin (MULTIVITAMIN) tablet Take 1 tablet by mouth daily.      . sildenafil (VIAGRA) 100 MG tablet Take 0.5-1 tablets (50-100 mg total) by mouth daily as needed for erectile dysfunction. 5 tablet 11   No current facility-administered  medications for this visit.     Allergies-reviewed and updated No Known Allergies  Social History   Social History  . Marital status: Married    Spouse name: N/A  . Number of children: N/A  . Years of education: N/A   Social History Main Topics  . Smoking status: Never Smoker  . Smokeless tobacco: Never Used  . Alcohol use No  . Drug use: No  . Sexual activity: Not Asked   Other Topics Concern  . None   Social History Narrative   Married. 2 sons.1 grandson.  1 greatgrandson as well. Wife bertha      Retired from Software engineer: bowling, golfing    Objective: BP (!) 144/82   Pulse (!) 59   Temp 98.3 F (36.8 C) (Oral)   Ht 5\' 7"  (1.702 m)   Wt 200 lb 3.2 oz (90.8 kg)   SpO2 96%   BMI 31.36 kg/m  Gen: NAD, resting comfortably HEENT: Mucous membranes are moist. Oropharynx normal Neck: no thyromegaly CV: RRR no murmurs rubs or gallops Lungs: CTAB no crackles, wheeze, rhonchi Abdomen: soft/nontender/nondistended/normal bowel sounds. No rebound or guarding.  Ext: no edema Skin: warm, dry Neuro: grossly normal, moves all extremities, PERRLA Rectal: normal tone, mild diffusely enlarged prostate, no masses or tenderness   Assessment/Plan:  66 y.o. male presenting for annual physical.  Health Maintenance counseling: 1. Anticipatory guidance: Patient counseled regarding regular dental exams q6 months, eye exams - yearly, wearing  seatbelts.  2. Risk factor reduction:  Advised patient of need for regular exercise and diet rich and fruits and vegetables to reduce risk of heart attack and stroke. Exercise- active with great grandson- walks when weather decent. Diet-weight stable over last years- nice balanced diet. .  Wt Readings from Last 3 Encounters:  06/01/16 200 lb 3.2 oz (90.8 kg)  04/08/15 197 lb (89.4 kg)  01/13/15 199 lb (90.3 kg)  3. Immunizations/screenings/ancillary studies- declines all immunizations . Advised Td if gets cut/scrape 4.  Prostate cancer screening- some BPH on exam but declines symptoms- will update PSA today  Lab Results  Component Value Date   PSA 0.75 01/13/2015   PSA 1.18 03/02/2013   5. Colon cancer - history colon cancer- under regular surveilance. Subtotal colectomy 07/2007- also had chemotherapy December 2009. Last flex sig 12/2014 with 3 year follow up with Dr. Fuller Plan  Status of chronic or acute concerns   Left achilles tendon tear last year- does not have full strength yet but improving  Hypertension S: controlled per [facility]- discussed guidelines of 130/80 related to spring trial as well.  BP Readings from Last 3 Encounters:  06/01/16 (!) 144/82  04/08/15 140/82  01/13/15 (!) 142/80  A/P: Declines meds as long as BP under JNC8 goal <150/90. Did encourage dash eating plan   Hyperlipidemia S: suspect poorly controlled on no medication. No myalgias.  Lab Results  Component Value Date   CHOL 222 (H) 01/13/2015   HDL 38.50 (L) 01/13/2015   LDLCALC 154 (H) 01/13/2015   LDLDIRECT 128.8 03/02/2013   TRIG 151.0 (H) 01/13/2015   CHOLHDL 6 01/13/2015   A/P: 13.5% 10 year risk in 2016- declined statin. States will decline unless he has a heart attack or stroke. Well aware of risks. Is taking aspirin though  1 year CPE. Offered 6 month BP check  Orders Placed This Encounter  Procedures  . CBC with Differential/Platelet  . Comprehensive metabolic panel    [place]    Order Specific Question:   Has the patient fasted?    Answer:   No  . Lipid panel    [place]    Order Specific Question:   Has the patient fasted?    Answer:   No  . PSA   Return precautions advised.  Garret Reddish, MD

## 2016-06-01 NOTE — Assessment & Plan Note (Signed)
S: suspect poorly controlled on no medication. No myalgias.  Lab Results  Component Value Date   CHOL 222 (H) 01/13/2015   HDL 38.50 (L) 01/13/2015   LDLCALC 154 (H) 01/13/2015   LDLDIRECT 128.8 03/02/2013   TRIG 151.0 (H) 01/13/2015   CHOLHDL 6 01/13/2015   A/P: 13.5% 10 year risk in 2016- declined statin. States will decline unless he has a heart attack or stroke. Well aware of risks. Is taking aspirin though

## 2016-06-01 NOTE — Assessment & Plan Note (Signed)
S: controlled per [facility]- discussed guidelines of 130/80 related to spring trial as well.  BP Readings from Last 3 Encounters:  06/01/16 (!) 144/82  04/08/15 140/82  01/13/15 (!) 142/80  A/P: Declines meds as long as BP under JNC8 goal <150/90. Did encourage dash eating plan

## 2016-06-01 NOTE — Patient Instructions (Addendum)
Trial dash eating plan to see if we can get blood pressure under 140/90  Could consider repeat blood pressure check with me in 6 months. If you prefer not to do this- at least see me once yearly for physical  Please stop by lab before you go   DASH Eating Plan DASH stands for "Dietary Approaches to Stop Hypertension." The DASH eating plan is a healthy eating plan that has been shown to reduce high blood pressure (hypertension). It may also reduce your risk for type 2 diabetes, heart disease, and stroke. The DASH eating plan may also help with weight loss. What are tips for following this plan? General guidelines   Avoid eating more than 2,300 mg (milligrams) of salt (sodium) a day. If you have hypertension, you may need to reduce your sodium intake to 1,500 mg a day.  Limit alcohol intake to no more than 1 drink a day for nonpregnant women and 2 drinks a day for men. One drink equals 12 oz of beer, 5 oz of wine, or 1 oz of hard liquor.  Work with your health care provider to maintain a healthy body weight or to lose weight. Ask what an ideal weight is for you.  Get at least 30 minutes of exercise that causes your heart to beat faster (aerobic exercise) most days of the week. Activities may include walking, swimming, or biking.  Work with your health care provider or diet and nutrition specialist (dietitian) to adjust your eating plan to your individual calorie needs. Reading food labels   Check food labels for the amount of sodium per serving. Choose foods with less than 5 percent of the Daily Value of sodium. Generally, foods with less than 300 mg of sodium per serving fit into this eating plan.  To find whole grains, look for the word "whole" as the first word in the ingredient list. Shopping   Buy products labeled as "low-sodium" or "no salt added."  Buy fresh foods. Avoid canned foods and premade or frozen meals. Cooking   Avoid adding salt when cooking. Use salt-free seasonings  or herbs instead of table salt or sea salt. Check with your health care provider or pharmacist before using salt substitutes.  Do not fry foods. Cook foods using healthy methods such as baking, boiling, grilling, and broiling instead.  Cook with heart-healthy oils, such as olive, canola, soybean, or sunflower oil. Meal planning    Eat a balanced diet that includes:  5 or more servings of fruits and vegetables each day. At each meal, try to fill half of your plate with fruits and vegetables.  Up to 6-8 servings of whole grains each day.  Less than 6 oz of lean meat, poultry, or fish each day. A 3-oz serving of meat is about the same size as a deck of cards. One egg equals 1 oz.  2 servings of low-fat dairy each day.  A serving of nuts, seeds, or beans 5 times each week.  Heart-healthy fats. Healthy fats called Omega-3 fatty acids are found in foods such as flaxseeds and coldwater fish, like sardines, salmon, and mackerel.  Limit how much you eat of the following:  Canned or prepackaged foods.  Food that is high in trans fat, such as fried foods.  Food that is high in saturated fat, such as fatty meat.  Sweets, desserts, sugary drinks, and other foods with added sugar.  Full-fat dairy products.  Do not salt foods before eating.  Try to eat at least  2 vegetarian meals each week.  Eat more home-cooked food and less restaurant, buffet, and fast food.  When eating at a restaurant, ask that your food be prepared with less salt or no salt, if possible. What foods are recommended? The items listed may not be a complete list. Talk with your dietitian about what dietary choices are best for you. Grains  Whole-grain or whole-wheat bread. Whole-grain or whole-wheat pasta. Brown rice. [place] Morrow. Bulgur. Whole-grain and low-sodium cereals. Pita bread. Low-fat, low-sodium crackers. Whole-wheat flour tortillas. Vegetables  Fresh or frozen vegetables (raw, steamed, roasted, or  grilled). Low-sodium or reduced-sodium tomato and vegetable juice. Low-sodium or reduced-sodium tomato sauce and tomato paste. Low-sodium or reduced-sodium canned vegetables. Fruits  All fresh, dried, or frozen fruit. Canned fruit in natural juice (without added sugar). Meat and other protein foods  Skinless chicken or [place]. Ground chicken or [place]. Pork with fat trimmed off. Fish and seafood. Egg whites. Dried beans, peas, or lentils. Unsalted nuts, nut butters, and seeds. Unsalted canned beans. Lean cuts of beef with fat trimmed off. Low-sodium, lean deli meat. Dairy  Low-fat (1%) or fat-free (skim) milk. Fat-free, low-fat, or reduced-fat cheeses. Nonfat, low-sodium ricotta or cottage cheese. Low-fat or nonfat yogurt. Low-fat, low-sodium cheese. Fats and oils  Soft margarine without trans fats. Vegetable oil. Low-fat, reduced-fat, or light mayonnaise and salad dressings (reduced-sodium). Canola, safflower, olive, soybean, and sunflower oils. Avocado. Seasoning and other foods  Herbs. Spices. Seasoning mixes without salt. Unsalted popcorn and pretzels. Fat-free sweets. What foods are not recommended? The items listed may not be a complete list. Talk with your dietitian about what dietary choices are best for you. Grains  Baked goods made with fat, such as croissants, muffins, or some breads. Dry pasta or rice meal packs. Vegetables  Creamed or fried vegetables. Vegetables in a cheese sauce. Regular canned vegetables (not low-sodium or reduced-sodium). Regular canned tomato sauce and paste (not low-sodium or reduced-sodium). Regular tomato and vegetable juice (not low-sodium or reduced-sodium). Angie Fava. Olives. Fruits  Canned fruit in a light or heavy syrup. Fried fruit. Fruit in cream or butter sauce. Meat and other protein foods  Fatty cuts of meat. Ribs. Fried meat. Berniece Salines. Sausage. Bologna and other processed lunch meats. Salami. Fatback. Hotdogs. Bratwurst. Salted nuts and seeds. Canned  beans with added salt. Canned or smoked fish. Whole eggs or egg yolks. Chicken or [place] with skin. Dairy  Whole or 2% milk, cream, and half-and-half. Whole or full-fat cream cheese. Whole-fat or sweetened yogurt. Full-fat cheese. Nondairy creamers. Whipped toppings. Processed cheese and cheese spreads. Fats and oils  Butter. Stick margarine. Lard. Shortening. Ghee. Bacon fat. Tropical oils, such as coconut, palm kernel, or palm oil. Seasoning and other foods  Salted popcorn and pretzels. Onion salt, garlic salt, seasoned salt, table salt, and sea salt. Worcestershire sauce. Tartar sauce. Barbecue sauce. Teriyaki sauce. Soy sauce, including reduced-sodium. Steak sauce. Canned and packaged gravies. Fish sauce. Oyster sauce. Cocktail sauce. Horseradish that you find on the shelf. Ketchup. Mustard. Meat flavorings and tenderizers. Bouillon cubes. Hot sauce and Tabasco sauce. Premade or packaged marinades. Premade or packaged taco seasonings. Relishes. Regular salad dressings. Where to find more information:  National Heart, Lung, and [place]: https://wilson-eaton.com/  American Heart Association: www.heart.org Summary  The DASH eating plan is a healthy eating plan that has been shown to reduce high blood pressure (hypertension). It may also reduce your risk for type 2 diabetes, heart disease, and stroke.  With the DASH eating plan, you should limit  salt (sodium) intake to 2,300 mg a day. If you have hypertension, you may need to reduce your sodium intake to 1,500 mg a day.  When on the DASH eating plan, aim to eat more fresh fruits and vegetables, whole grains, lean proteins, low-fat dairy, and heart-healthy fats.  Work with your health care provider or diet and nutrition specialist (dietitian) to adjust your eating plan to your individual calorie needs. This information is not intended to replace advice given to you by your health care provider. Make sure you discuss any questions you have with  your health care provider. Document Released: 03/04/2011 Document Revised: 03/08/2016 Document Reviewed: 03/08/2016 Elsevier Interactive Patient Education  2017 [employer].

## 2016-12-29 ENCOUNTER — Ambulatory Visit: Payer: Medicare Other

## 2017-04-27 ENCOUNTER — Encounter: Payer: Medicare Other | Admitting: Family Medicine

## 2017-05-04 DIAGNOSIS — H43813 Vitreous degeneration, bilateral: Secondary | ICD-10-CM | POA: Diagnosis not present

## 2017-07-11 ENCOUNTER — Telehealth: Payer: Self-pay | Admitting: Family Medicine

## 2017-07-11 ENCOUNTER — Encounter: Payer: Self-pay | Admitting: Family Medicine

## 2017-07-11 ENCOUNTER — Ambulatory Visit (INDEPENDENT_AMBULATORY_CARE_PROVIDER_SITE_OTHER): Payer: Medicare Other | Admitting: Family Medicine

## 2017-07-11 VITALS — BP 138/70 | HR 66 | Temp 98.5°F | Ht 67.0 in | Wt 197.6 lb

## 2017-07-11 DIAGNOSIS — Z0000 Encounter for general adult medical examination without abnormal findings: Secondary | ICD-10-CM

## 2017-07-11 DIAGNOSIS — Z125 Encounter for screening for malignant neoplasm of prostate: Secondary | ICD-10-CM | POA: Diagnosis not present

## 2017-07-11 DIAGNOSIS — E785 Hyperlipidemia, unspecified: Secondary | ICD-10-CM | POA: Diagnosis not present

## 2017-07-11 DIAGNOSIS — I10 Essential (primary) hypertension: Secondary | ICD-10-CM | POA: Diagnosis not present

## 2017-07-11 LAB — CBC
HCT: 43.2 % (ref 39.0–52.0)
Hemoglobin: 13.9 g/dL (ref 13.0–17.0)
MCHC: 32.2 g/dL (ref 30.0–36.0)
MCV: 82.9 fl (ref 78.0–100.0)
Platelets: 209 10*3/uL (ref 150.0–400.0)
RBC: 5.22 Mil/uL (ref 4.22–5.81)
RDW: 14.5 % (ref 11.5–15.5)
WBC: 5 10*3/uL (ref 4.0–10.5)

## 2017-07-11 LAB — COMPREHENSIVE METABOLIC PANEL
ALK PHOS: 48 U/L (ref 39–117)
ALT: 17 U/L (ref 0–53)
AST: 21 U/L (ref 0–37)
Albumin: 4.8 g/dL (ref 3.5–5.2)
BUN: 14 mg/dL (ref 6–23)
CO2: 26 mEq/L (ref 19–32)
Calcium: 9.6 mg/dL (ref 8.4–10.5)
Chloride: 105 mEq/L (ref 96–112)
Creatinine, Ser: 1.33 mg/dL (ref 0.40–1.50)
GFR: 68.55 mL/min (ref >60.00)
GLUCOSE: 93 mg/dL (ref 70–99)
Potassium: 4.1 mEq/L (ref 3.5–5.1)
SODIUM: 140 meq/L (ref 135–145)
TOTAL PROTEIN: 7.7 g/dL (ref 6.0–8.3)
Total Bilirubin: 0.7 mg/dL (ref 0.2–1.2)

## 2017-07-11 LAB — LIPID PANEL
Cholesterol: 217 mg/dL — ABNORMAL HIGH (ref 0–200)
HDL: 41.2 mg/dL (ref >39.00)
LDL CALC: 138 mg/dL — AB (ref 0–99)
NONHDL: 176.1
Total CHOL/HDL Ratio: 5
Triglycerides: 189 mg/dL — ABNORMAL HIGH (ref 0.0–149.0)
VLDL: 37.8 mg/dL (ref 0.0–40.0)

## 2017-07-11 LAB — PSA: PSA: 0.8 ng/mL (ref 0.10–4.00)

## 2017-07-11 MED ORDER — SILDENAFIL CITRATE 100 MG PO TABS: 50.0000 mg | ORAL_TABLET | Freq: Every day | ORAL | 11 refills | Status: DC | PRN

## 2017-07-11 NOTE — Telephone Encounter (Signed)
May change to cialis 20mg  q48 hours prn for ED #10 with 5 refills

## 2017-07-11 NOTE — Progress Notes (Signed)
Your CBC was normal (blood counts, infection fighting cells, platelets). Your CMET was normal (kidney, liver, and electrolytes, blood sugar).  Your cholesterol remains moderately elevated. Your 10 year risk of heart attack or stroke based on the ASCVD risk calculation is 14%. You agreed if # gets above 20% that you would consider cholesterol medicine. We will recheck this yearly Your PSA trend is low risk for prostate cancer

## 2017-07-11 NOTE — Telephone Encounter (Signed)
Copied from [place] [phone]. Topic: General - Other >> Jul 11, 2017  2:38 PM Darl Householder, RMA wrote: Reason for CRM: patient is requesting call back due to wrong medication was sent to the pharmacy, sildenafil (VIAGRA) 100 MG tablet [was sent but pt wanted Cialis

## 2017-07-11 NOTE — Addendum Note (Signed)
Addended by: Lucianne Lei M on: 07/11/2017 11:38 AM   Modules accepted: Orders

## 2017-07-11 NOTE — Patient Instructions (Addendum)
I would also like for you to sign up for an annual wellness visit with one of our nurses, Cassie or Manuela Schwartz, who both specialize in the annual wellness visit. This is a free benefit under medicare that may help [place] find additional ways to help you. Some highlights are reviewing medications, lifestyle, and doing a dementia screen.   No changes today. Blood pressure looking better! Happy to check this in 6 months if youd like- otherwise see you in a year. Consider cholesterol medicine if 10 year risk of heart attack or stroke is above 20%  Please stop by lab before you go

## 2017-07-11 NOTE — Progress Notes (Signed)
Phone: [phone]  Subjective:  Patient presents today for their annual physical. Chief complaint-noted.   See problem oriented charting- ROS- full  review of systems was completed and negative including No chest pain or shortness of breath. No headache or blurry vision.    The following were reviewed and entered/updated in epic: Past Medical History:  Diagnosis Date  . Cataract    cataract extraction  . Colon cancer ([place]) 08/23/2007   chemo   Patient Active Problem List   Diagnosis Date Noted  . Hyperlipidemia 04/08/2015    Priority: Medium  . History of colon cancer 11/05/2009    Priority: Medium  . Hypertension 11/24/2006    Priority: Medium  . ERECTILE DYSFUNCTION 11/24/2006   Past Surgical History:  Procedure Laterality Date  . CATARACT EXTRACTION W/PHACO Right 09/27/2012   Procedure: CATARACT EXTRACTION PHACO AND INTRAOCULAR LENS PLACEMENT (IOC);  Surgeon: Adonis Brook, MD;  Location: [facility];  Service: Ophthalmology;  Laterality: Right;  . CATARACT EXTRACTION W/PHACO Left 11/01/2012   Procedure: CATARACT EXTRACTION PHACO AND INTRAOCULAR LENS PLACEMENT (IOC);  Surgeon: Adonis Brook, MD;  Location: [facility];  Service: Ophthalmology;  Laterality: Left;  . COLECTOMY  2009   colon cancer    Family History  Problem Relation Age of Onset  . Colon cancer Neg Hx   . Esophageal cancer Neg Hx   . Rectal cancer Neg Hx   . Stomach cancer Neg Hx   . Hypertension Mother   . Hypertension Father     Medications- reviewed and updated Current Outpatient Medications  Medication Sig Dispense Refill  . acetaminophen (TYLENOL) 500 MG tablet Take 500 mg by mouth every 4 (four) hours as needed for pain (for occasional pain/headache).     . Multiple Vitamin (MULTIVITAMIN) tablet Take 1 tablet by mouth daily.      . sildenafil (VIAGRA) 100 MG tablet Take 0.5-1 tablets (50-100 mg total) by mouth daily as needed for erectile dysfunction. 5 tablet 11   No current facility-administered  medications for this visit.     Allergies-reviewed and updated No Known Allergies  Social History   Social History Narrative   Married. 2 sons.1 grandson.  1 greatgrandson as well, coming [phone] greatgranddaughter due.  Wife bertha      Retired from Software engineer: bowling, golfing    Objective: BP 138/70 (BP Location: Left Arm, Patient Position: Sitting, Cuff Size: Large)   Pulse 66   Temp 98.5 F (36.9 C) (Oral)   Ht 5\' 7"  (1.702 m)   Wt 197 lb 9.6 oz (89.6 kg)   SpO2 97%   BMI 30.95 kg/m  Gen: NAD, resting comfortably HEENT: Mucous membranes are moist. Oropharynx normal Neck: no thyromegaly CV: RRR no murmurs rubs or gallops Lungs: CTAB no crackles, wheeze, rhonchi Abdomen: soft/nontender/nondistended/normal bowel sounds. No rebound or guarding.  Ext: no edema Skin: warm, dry Neuro: grossly normal, moves all extremities, PERRLA Rectal: normal tone, normal sized prostate, no masses or tenderness  Assessment/Plan:  67 y.o. male presenting for annual physical.  Health Maintenance counseling: 1. Anticipatory guidance: Patient counseled regarding regular dental exams -q6 months, eye exams -yearly, wearing seatbelts.  2. Risk factor reduction:  Advised patient of need for regular exercise and diet rich and fruits and vegetables to reduce risk of heart attack and stroke. He has continued walking - having to walk some by himself with wife not feeling great-  when weather is decent. Still with reasonable diet - down 3 lbs  this year.  Wt Readings from Last 3 Encounters:  07/11/17 197 lb 9.6 oz (89.6 kg)  06/01/16 200 lb 3.2 oz (90.8 kg)  04/08/15 197 lb (89.4 kg)  3. Immunizations/screenings/ancillary studies- once again declines all immunizations.  4. Prostate cancer screening-  update psa trend today. Low risk exam other than some BPH on exam but really no symptoms.  Lab Results  Component Value Date   PSA 1.11 06/01/2016   PSA 0.75 01/13/2015   PSA 1.18  03/02/2013   5. Colon cancer screening - subtotal colectomy 08/23/07. Also had adjuvant chemoterhapy which he completed December 2009. Last flex sig 2016- with follow up planned this year with Dr. Fuller Plan.   Status of chronic or acute concerns   HLD- 10 year risk 13.5% in 2016- has declined statin- he did agree to consider at 20%   HTN- under both jnc7 <140/90 and jnc8 goals today  <150/90 without meds. Doing low salt, high veggie diet.   1 year CPE. Offered 6 month BP recheck  Lab/Order associations: Preventative health care - Plan: PSA, Lipid panel, CBC, Comprehensive metabolic panel  Hyperlipidemia, unspecified hyperlipidemia type - Plan: Lipid panel, CBC, Comprehensive metabolic panel  Essential hypertension - Plan: Lipid panel, CBC, Comprehensive metabolic panel  Screening PSA (prostate specific antigen) - Plan: PSA  Return precautions advised.  Garret Reddish, MD

## 2017-07-12 ENCOUNTER — Other Ambulatory Visit: Payer: Self-pay

## 2017-07-12 DIAGNOSIS — F528 Other sexual dysfunction not due to a substance or known physiological condition: Secondary | ICD-10-CM

## 2017-07-12 MED ORDER — TADALAFIL 20 MG PO TABS: 20.0000 mg | ORAL_TABLET | ORAL | 5 refills | Status: DC | PRN

## 2017-07-12 NOTE — Telephone Encounter (Signed)
Cialis was sent in to patient pharmacy.

## 2017-07-18 NOTE — Telephone Encounter (Signed)
Pt called about the Cialis Rx, the medication is too expensive and he is wanting to know if he can get the old Rx of 5mg /Cialis 30 tablets; or an alternate medication that is more Estate manager/land agent

## 2017-07-20 ENCOUNTER — Other Ambulatory Visit: Payer: Self-pay

## 2017-07-20 DIAGNOSIS — F528 Other sexual dysfunction not due to a substance or known physiological condition: Secondary | ICD-10-CM

## 2017-07-20 MED ORDER — TADALAFIL 20 MG PO TABS: 10.0000 mg | ORAL_TABLET | ORAL | 11 refills | Status: DC | PRN

## 2017-07-20 NOTE — Telephone Encounter (Signed)
Cialis 10mg  was sent in to patient pharmacy. Cialis doesn't come in 5mg .

## 2017-10-05 NOTE — Progress Notes (Signed)
Subjective:   Jeffery [name] is a 67 y.o. male who presents for an Initial Medicare Annual Wellness Visit.  Reports health as great  Last OV 07/11/2017 Hx of colon cancer 07/2007 (oncology dr. Benay Spice)  Married with 2 sons One lives in [place] and one in [place] One grandson, great grands as well (4 and 4 months)   Diet BMI 31  Chol/hdl 5; hdl 41; trig 189   Exercise Hobbies bowling and golfing  Plays basketball x  1 day a week   There are no preventive care reminders to display for this patient.  PSA 06/2017 Colonoscopy 12/2014 to repeat this year 12/2017  Educate regarding shingrix - declines   Cardiac Risk Factors include: family history of premature cardiovascular disease;male gender     Objective:    Today's Vitals   10/06/17 1100 10/06/17 1121  BP: (!) 166/80 (!) 154/80  Pulse: 65   SpO2: 97%   Weight: 199 lb (90.3 kg)   Height: 5\' 7"  (1.702 m)    Body mass index is 31.17 kg/m.  Advanced Directives 10/06/2017 01/09/2015 10/19/2012 09/20/2012  Does Patient Have a Medical Advance Directive? Yes Yes Patient has advance directive, copy not in chart Patient has advance directive, copy not in chart  Type of Advance Directive - [place] will  [place] in Chart? - - - Copy requested from family    Current Medications (verified) Outpatient Encounter Medications as of 10/06/2017  Medication Sig  . acetaminophen (TYLENOL) 500 MG tablet Take 500 mg by mouth every 4 (four) hours as needed for pain (for occasional pain/headache).   . Multiple Vitamin (MULTIVITAMIN) tablet Take 1 tablet by mouth daily.    . sildenafil (VIAGRA) 100 MG tablet Take 0.5-1 tablets (50-100 mg total) by mouth daily as needed for erectile dysfunction.  . tadalafil (CIALIS) 20 MG tablet Take 0.5 tablets (10 mg total) by mouth every other day as needed for erectile dysfunction.   No facility-administered encounter medications on file as of  10/06/2017.     Allergies (verified) Patient has no known allergies.   History: Past Medical History:  Diagnosis Date  . Cataract    cataract extraction  . Colon cancer ([place] [facility]) 08/23/2007   chemo   Past Surgical History:  Procedure Laterality Date  . CATARACT EXTRACTION W/PHACO Right 09/27/2012   Procedure: CATARACT EXTRACTION PHACO AND INTRAOCULAR LENS PLACEMENT (IOC);  Surgeon: Adonis Brook, MD;  Location: [facility];  Service: Ophthalmology;  Laterality: Right;  . CATARACT EXTRACTION W/PHACO Left 11/01/2012   Procedure: CATARACT EXTRACTION PHACO AND INTRAOCULAR LENS PLACEMENT (IOC);  Surgeon: Adonis Brook, MD;  Location: [facility];  Service: Ophthalmology;  Laterality: Left;  . COLECTOMY  2009   colon cancer   Family History  Problem Relation Age of Onset  . Colon cancer Neg Hx   . Esophageal cancer Neg Hx   . Rectal cancer Neg Hx   . Stomach cancer Neg Hx   . Hypertension Mother   . Hypertension Father    Social History   Socioeconomic History  . Marital status: Married    Spouse name: Not on file  . Number of children: Not on file  . Years of education: Not on file  . Highest education level: Not on file  Occupational History  . Not on file  Social Needs  . Financial resource strain: Not on file  . Food insecurity:    Worry: Not on file  Inability: Not on file  . Transportation needs:    Medical: Not on file    Non-medical: Not on file  Tobacco Use  . Smoking status: Never Smoker  . Smokeless tobacco: Never Used  Substance and Sexual Activity  . Alcohol use: No    Alcohol/week: 0.0 oz  . Drug use: No  . Sexual activity: Not on file  Lifestyle  . Physical activity:    Days per week: Not on file    Minutes per session: Not on file  . Stress: Not on file  Relationships  . Social connections:    Talks on phone: Not on file    Gets together: Not on file    Attends religious service: Not on file    Active member of club or organization: Not on file    Attends  meetings of clubs or organizations: Not on file    Relationship status: Not on file  Other Topics Concern  . Not on file  Social History Narrative   Married. 2 sons.1 grandson.  1 greatgrandson as well, coming [phone] greatgranddaughter due.  Wife bertha      Retired from Psychologist, educational       Hobbies: bowling, golfing    Tobacco Counseling Counseling given: Yes   Clinical Intake:    Activities of Daily Living In your present state of health, do you have any difficulty performing the following activities: 10/06/2017  Hearing? N  Vision? N  Difficulty concentrating or making decisions? N  Walking or climbing stairs? N  Dressing or bathing? N  Doing errands, shopping? N  Preparing Food and eating ? N  Using the Toilet? N  In the past six months, have you accidently leaked urine? N  Do you have problems with loss of bowel control? N  Managing your Medications? N  Managing your Finances? N  Housekeeping or managing your Housekeeping? N  Some recent data might be hidden     Immunizations and Health Maintenance  There is no immunization history on file for this patient. There are no preventive care reminders to display for this patient.  Patient Care Team: Marin Olp, MD as PCP - General (Family Medicine)  Indicate any recent Medical Services you may have received from other than [facility] providers in the past year (date may be approximate).     Assessment:   This is a routine wellness examination for Jeffery [name].  Hearing/Vision screen  Hearing Screening   125Hz  250Hz  500Hz  1000Hz  2000Hz  3000Hz  4000Hz  6000Hz  8000Hz   Right ear:       100    Left ear:       100    Vision Screening Comments: Vision issues Cataract in the past Dr. [place]ia Maxon No issues  Dietary issues and exercise activities discussed: Current Exercise Habits: Home exercise routine, Type of exercise: Other - see comments(bowling and golf ), Intensity: Moderate  Goals    . Exercise 150 min/wk Moderate  Activity     Walks in the afternoons Walks 30 minutes x 2 and to increase days of the week       Depression Screen PHQ 2/9 Scores 10/06/2017 07/11/2017 06/01/2016 01/13/2015  PHQ - 2 Score 0 0 0 0    Fall Risk Fall Risk  10/06/2017 07/11/2017 06/01/2016 01/13/2015  Falls in the past year? No No No No     Cognitive Function: MMSE - Mini Mental State Exam 10/06/2017  Not completed: (No Data)     Ad8 score reviewed for issues:  Issues  making decisions:  Less interest in hobbies / activities:  Repeats questions, stories (family complaining):  Trouble using ordinary gadgets (microwave, computer, phone):  Forgets the month or year:   Mismanaging finances:   Remembering appts:  Daily problems with thinking and/or memory: Ad8 score is=0        Screening Tests Health Maintenance  Topic Date Due  . INFLUENZA VACCINE  01/12/2045 (Originally 10/27/2017)  . TETANUS/TDAP  01/12/2045 (Originally 06/16/1969)  . PNA vac Low Risk Adult (1 of 2 - PCV13) 01/12/2045 (Originally 06/15/2013)  . COLONOSCOPY  01/08/2018  . Hepatitis C Screening  Completed        Plan:      PCP Notes   Health Maintenance Does not take vaccines Educated regarding shingrix and possible post herpetic pain but declines   Abnormal Screens  BP mod elevated 164/80 and down to 154 /80  Educated regarding kidney disease and the dash diet. Sometimes warm lemon water in the am will help with sodium Encouraged to try to limit salt every other day with lean choices etc.  Will check BP at the drug store and keep a record of his reading for his next OV   Referrals  none  Patient concerns; Would like Cialis refilled; Basket note sent    Nurse Concerns; As noted   Next PCP apt TBS        I have personally reviewed and noted the following in the patient's chart:   . Medical and social history . Use of alcohol, tobacco or illicit drugs  . Current medications and supplements . Functional ability  and status . Nutritional status . Physical activity . Advanced directives . List of other physicians . Hospitalizations, surgeries, and ER visits in previous 12 months . Vitals . Screenings to include cognitive, depression, and falls . Referrals and appointments  In addition, I have reviewed and discussed with patient certain preventive protocols, quality metrics, and best practice recommendations. A written personalized care plan for preventive services as well as general preventive health recommendations were provided to patient.     Wynetta Fines, RN   10/06/2017

## 2017-10-06 ENCOUNTER — Ambulatory Visit (INDEPENDENT_AMBULATORY_CARE_PROVIDER_SITE_OTHER): Payer: Medicare Other | Admitting: *Deleted

## 2017-10-06 ENCOUNTER — Encounter: Payer: Self-pay | Admitting: *Deleted

## 2017-10-06 ENCOUNTER — Other Ambulatory Visit: Payer: Self-pay

## 2017-10-06 VITALS — BP 154/80 | HR 65 | Ht 67.0 in | Wt 199.0 lb

## 2017-10-06 DIAGNOSIS — Z0000 Encounter for general adult medical examination without abnormal findings: Secondary | ICD-10-CM

## 2017-10-06 NOTE — Patient Instructions (Addendum)
Jeffery [name] , Thank you for taking time to come for your Medicare Wellness Visit. I appreciate your ongoing commitment to your health goals. Please review the following plan we discussed and let me know if I can assist you in the future.   Try to take your BP whenever you are in the drug store and write down your numbers.  Drinks hot lemon water in the am will help with fluid.   Educated to check with insurance regarding coverage of Shingles vaccination on Part D or Part B and may have lower co-pay if provided on the Part D side  Shingrix is a vaccine for the prevention of Shingles in Adults 69 and older.  If you are on Medicare, the shingrix is covered under your Part D plan, so you will take both of the vaccines in the series at your pharmacy. Please check with your benefits regarding applicable copays or out of pocket expenses.  The Shingrix is given in 2 vaccines approx 8 weeks apart. You must receive the 2nd dose prior to 6 months from receipt of the first. Please have the pharmacist print out you Immunization  dates for our office records   These are the goals we discussed: Goals    . Exercise 150 min/wk Moderate Activity     Walks in the afternoons Walks 30 minutes x 2 and to increase days of the week        This is a list of the screening recommended for you and due dates:  Health Maintenance  Topic Date Due  . Flu Shot  01/12/2045*  . Tetanus Vaccine  01/12/2045*  . Pneumonia vaccines (1 of 2 - PCV13) 01/12/2045*  . Colon Cancer Screening  01/08/2018  .  Hepatitis C: One time screening is recommended by Center for Disease Control  (CDC) for  adults born from [birth year] through 1967.   Completed  *Topic was postponed. The date shown is not the original due date.    DASH Eating Plan DASH stands for "Dietary Approaches to Stop Hypertension." The DASH eating plan is a healthy eating plan that has been shown to reduce high blood pressure (hypertension). It may also reduce your risk  for type 2 diabetes, heart disease, and stroke. The DASH eating plan may also help with weight loss. What are tips for following this plan? General guidelines  Avoid eating more than 2,300 mg (milligrams) of salt (sodium) a day. If you have hypertension, you may need to reduce your sodium intake to 1,500 mg a day.  Limit alcohol intake to no more than 1 drink a day for nonpregnant women and 2 drinks a day for men. One drink equals 12 oz of beer, 5 oz of wine, or 1 oz of hard liquor.  Work with your health care provider to maintain a healthy body weight or to lose weight. Ask what an ideal weight is for you.  Get at least 30 minutes of exercise that causes your heart to beat faster (aerobic exercise) most days of the week. Activities may include walking, swimming, or biking.  Work with your health care provider or diet and nutrition specialist (dietitian) to adjust your eating plan to your individual calorie needs. Reading food labels  Check food labels for the amount of sodium per serving. Choose foods with less than 5 percent of the Daily Value of sodium. Generally, foods with less than 300 mg of sodium per serving fit into this eating plan.  To find whole grains, look for  the word "whole" as the first word in the ingredient list. Shopping  Buy products labeled as "low-sodium" or "no salt added."  Buy fresh foods. Avoid canned foods and premade or frozen meals. Cooking  Avoid adding salt when cooking. Use salt-free seasonings or herbs instead of table salt or sea salt. Check with your health care provider or pharmacist before using salt substitutes.  Do not fry foods. Cook foods using healthy methods such as baking, boiling, grilling, and broiling instead.  Cook with heart-healthy oils, such as olive, canola, soybean, or sunflower oil. Meal planning   Eat a balanced diet that includes: ? 5 or more servings of fruits and vegetables each day. At each meal, try to fill half of your  plate with fruits and vegetables. ? Up to 6-8 servings of whole grains each day. ? Less than 6 oz of lean meat, poultry, or fish each day. A 3-oz serving of meat is about the same size as a deck of cards. One egg equals 1 oz. ? 2 servings of low-fat dairy each day. ? A serving of nuts, seeds, or beans 5 times each week. ? Heart-healthy fats. Healthy fats called Omega-3 fatty acids are found in foods such as flaxseeds and coldwater fish, like sardines, salmon, and mackerel.  Limit how much you eat of the following: ? Canned or prepackaged foods. ? Food that is high in trans fat, such as fried foods. ? Food that is high in saturated fat, such as fatty meat. ? Sweets, desserts, sugary drinks, and other foods with added sugar. ? Full-fat dairy products.  Do not salt foods before eating.  Try to eat at least 2 vegetarian meals each week.  Eat more home-cooked food and less restaurant, buffet, and fast food.  When eating at a restaurant, ask that your food be prepared with less salt or no salt, if possible. What foods are recommended? The items listed may not be a complete list. Talk with your dietitian about what dietary choices are best for you. Grains Whole-grain or whole-wheat bread. Whole-grain or whole-wheat pasta. Brown rice. [place]. Bulgur. Whole-grain and low-sodium cereals. Pita bread. Low-fat, low-sodium crackers. Whole-wheat flour tortillas. Vegetables Fresh or frozen vegetables (raw, steamed, roasted, or grilled). Low-sodium or reduced-sodium tomato and vegetable juice. Low-sodium or reduced-sodium tomato sauce and tomato paste. Low-sodium or reduced-sodium canned vegetables. Fruits All fresh, dried, or frozen fruit. Canned fruit in natural juice (without added sugar). Meat and other protein foods Skinless chicken or [place]. Ground chicken or [place]. Pork with fat trimmed off. Fish and seafood. Egg whites. Dried beans, peas, or lentils. Unsalted nuts, nut butters, and  seeds. Unsalted canned beans. Lean cuts of beef with fat trimmed off. Low-sodium, lean deli meat. Dairy Low-fat (1%) or fat-free (skim) milk. Fat-free, low-fat, or reduced-fat cheeses. Nonfat, low-sodium ricotta or cottage cheese. Low-fat or nonfat yogurt. Low-fat, low-sodium cheese. Fats and oils Soft margarine without trans fats. Vegetable oil. Low-fat, reduced-fat, or light mayonnaise and salad dressings (reduced-sodium). Canola, safflower, olive, soybean, and sunflower oils. Avocado. Seasoning and other foods Herbs. Spices. Seasoning mixes without salt. Unsalted popcorn and pretzels. Fat-free sweets. What foods are not recommended? The items listed may not be a complete list. Talk with your dietitian about what dietary choices are best for you. Grains Baked goods made with fat, such as croissants, muffins, or some breads. Dry pasta or rice meal packs. Vegetables Creamed or fried vegetables. Vegetables in a cheese sauce. Regular canned vegetables (not low-sodium or reduced-sodium). Regular canned  tomato sauce and paste (not low-sodium or reduced-sodium). Regular tomato and vegetable juice (not low-sodium or reduced-sodium). Angie Fava. Olives. Fruits Canned fruit in a light or heavy syrup. Fried fruit. Fruit in cream or butter sauce. Meat and other protein foods Fatty cuts of meat. Ribs. Fried meat. Berniece Salines. Sausage. Bologna and other processed lunch meats. Salami. Fatback. Hotdogs. Bratwurst. Salted nuts and seeds. Canned beans with added salt. Canned or smoked fish. Whole eggs or egg yolks. Chicken or [place] with skin. Dairy Whole or 2% milk, cream, and half-and-half. Whole or full-fat cream cheese. Whole-fat or sweetened yogurt. Full-fat cheese. Nondairy creamers. Whipped toppings. Processed cheese and cheese spreads. Fats and oils Butter. Stick margarine. Lard. Shortening. Ghee. Bacon fat. Tropical oils, such as coconut, palm kernel, or palm oil. Seasoning and other foods Salted popcorn and  pretzels. Onion salt, garlic salt, seasoned salt, table salt, and sea salt. Worcestershire sauce. Tartar sauce. Barbecue sauce. Teriyaki sauce. Soy sauce, including reduced-sodium. Steak sauce. Canned and packaged gravies. Fish sauce. Oyster sauce. Cocktail sauce. Horseradish that you find on the shelf. Ketchup. Mustard. Meat flavorings and tenderizers. Bouillon cubes. Hot sauce and Tabasco sauce. Premade or packaged marinades. Premade or packaged taco seasonings. Relishes. Regular salad dressings. Where to find more information:  National Heart, Lung, and [place]: https://wilson-eaton.com/  American Heart Association: www.heart.org Summary  The DASH eating plan is a healthy eating plan that has been shown to reduce high blood pressure (hypertension). It may also reduce your risk for type 2 diabetes, heart disease, and stroke.  With the DASH eating plan, you should limit salt (sodium) intake to 2,300 mg a day. If you have hypertension, you may need to reduce your sodium intake to 1,500 mg a day.  When on the DASH eating plan, aim to eat more fresh fruits and vegetables, whole grains, lean proteins, low-fat dairy, and heart-healthy fats.  Work with your health care provider or diet and nutrition specialist (dietitian) to adjust your eating plan to your individual calorie needs. This information is not intended to replace advice given to you by your health care provider. Make sure you discuss any questions you have with your health care provider. Document Released: 03/04/2011 Document Revised: 03/08/2016 Document Reviewed: 03/08/2016 Elsevier Interactive Patient Education  2018 [employer].   Fat and Cholesterol Restricted Diet High levels of fat and cholesterol in your blood may lead to various health problems, such as diseases of the heart, blood vessels, gallbladder, liver, and pancreas. Fats are concentrated sources of energy that come in various forms. Certain types of fat, including  saturated fat, may be harmful in excess. Cholesterol is a substance needed by your body in small amounts. Your body makes all the cholesterol it needs. Excess cholesterol comes from the food you eat. When you have high levels of cholesterol and saturated fat in your blood, health problems can develop because the excess fat and cholesterol will gather along the walls of your blood vessels, causing them to narrow. Choosing the right foods will help you control your intake of fat and cholesterol. This will help keep the levels of these substances in your blood within normal limits and reduce your risk of disease. What is my plan? Your health care provider recommends that you:  Limit your fat intake to ______% or less of your total calories per day.  Limit the amount of cholesterol in your diet to less than _________mg per day.  Eat 20-30 grams of fiber each day.  What types of fat should  I choose?  Choose healthy fats more often. Choose monounsaturated and polyunsaturated fats, such as olive and canola oil, flaxseeds, walnuts, almonds, and seeds.  Eat more omega-3 fats. Good choices include salmon, mackerel, sardines, tuna, flaxseed oil, and ground flaxseeds. Aim to eat fish at least two times a week.  Limit saturated fats. Saturated fats are primarily found in animal products, such as meats, butter, and cream. Plant sources of saturated fats include palm oil, palm kernel oil, and coconut oil.  Avoid foods with partially hydrogenated oils in them. These contain trans fats. Examples of foods that contain trans fats are stick margarine, some tub margarines, cookies, crackers, and other baked goods. What general guidelines do I need to follow? These guidelines for healthy eating will help you control your intake of fat and cholesterol:  Check food labels carefully to identify foods with trans fats or high amounts of saturated fat.  Fill one half of your plate with vegetables and green  salads.  Fill one fourth of your plate with whole grains. Look for the word "whole" as the first word in the ingredient list.  Fill one fourth of your plate with lean protein foods.  Limit fruit to two servings a day. Choose fruit instead of juice.  Eat more foods that contain fiber, such as apples, broccoli, carrots, beans, peas, and barley.  Eat more home-cooked food and less restaurant, buffet, and fast food.  Limit or avoid alcohol.  Limit foods high in starch and sugar.  Limit fried foods.  Cook foods using methods other than frying. Baking, boiling, grilling, and broiling are all great options.  Lose weight if you are overweight. Losing just 5-10% of your initial body weight can help your overall health and prevent diseases such as diabetes and heart disease.  What foods can I eat? Grains  Whole grains, such as whole wheat or whole grain breads, crackers, cereals, and pasta. Unsweetened oatmeal, bulgur, barley, quinoa, or brown rice. Corn or whole wheat flour tortillas. Vegetables  Fresh or frozen vegetables (raw, steamed, roasted, or grilled). Green salads. Fruits  All fresh, canned (in natural juice), or frozen fruits. Meats and other protein foods  Ground beef (85% or leaner), grass-fed beef, or beef trimmed of fat. Skinless chicken or [place]. Ground chicken or [place]. Pork trimmed of fat. All fish and seafood. Eggs. Dried beans, peas, or lentils. Unsalted nuts or seeds. Unsalted canned or dry beans. Dairy  Low-fat dairy products, such as skim or 1% milk, 2% or reduced-fat cheeses, low-fat ricotta or cottage cheese, or plain low-fat yo Fats and oils  Tub margarines without trans fats. Light or reduced-fat mayonnaise and salad dressings. Avocado. Olive, canola, sesame, or safflower oils. Natural peanut or almond butter (choose ones without added sugar and oil). The items listed above may not be a complete list of recommended foods or beverages. Contact your dietitian  for more options. Foods to avoid Grains  White bread. White pasta. White rice. Cornbread. Bagels, pastries, and croissants. Crackers that contain trans fat. Vegetables  White potatoes. Corn. Creamed or fried vegetables. Vegetables in a cheese sauce. Fruits  Dried fruits. Canned fruit in light or heavy syrup. Fruit juice. Meats and other protein foods  Fatty cuts of meat. Ribs, chicken wings, bacon, sausage, bologna, salami, chitterlings, fatback, hot dogs, bratwurst, and packaged luncheon meats. Liver and organ meats. Dairy  Whole or 2% milk, cream, half-and-half, and cream cheese. Whole milk cheeses. Whole-fat or sweetened yogurt. Full-fat cheeses. Nondairy creamers and whipped toppings. Processed  cheese, cheese spreads, or cheese curds. Beverages  Alcohol. Sweetened drinks (such as sodas, lemonade, and fruit drinks or punches). Fats and oils  Butter, stick margarine, lard, shortening, ghee, or bacon fat. Coconut, palm kernel, or palm oils. Sweets and desserts  Corn syrup, sugars, honey, and molasses. Candy. Jam and jelly. Syrup. Sweetened cereals. Cookies, pies, cakes, donuts, muffins, and ice cream. The items listed above may not be a complete list of foods and beverages to avoid. Contact your dietitian for more information. This information is not intended to replace advice given to you by your health care provider. Make sure you discuss any questions you have with your health care provider. Document Released: 03/15/2005 Document Revised: 04/05/2014 Document Reviewed: 06/13/2013 Elsevier Interactive Patient Education  2018 [place] in the Home Falls can cause injuries. They can happen to people of all ages. There are many things you can do to make your home safe and to help prevent falls. What can I do on the outside of my home?  Regularly fix the edges of walkways and driveways and fix any cracks.  Remove anything that might make you trip as you walk  through a door, such as a raised step or threshold.  Trim any bushes or trees on the path to your home.  Use bright outdoor lighting.  Clear any walking paths of anything that might make someone trip, such as rocks or tools.  Regularly check to see if handrails are loose or broken. Make sure that both sides of any steps have handrails.  Any raised decks and porches should have guardrails on the edges.  Have any leaves, snow, or ice cleared regularly.  Use sand or salt on walking paths during winter.  Clean up any spills in your garage right away. This includes oil or grease spills. What can I do in the bathroom?  Use night lights.  Install grab bars by the toilet and in the tub and shower. Do not use towel bars as grab bars.  Use non-skid mats or decals in the tub or shower.  If you need to sit down in the shower, use a plastic, non-slip stool.  Keep the floor dry. Clean up any water that spills on the floor as soon as it happens.  Remove soap buildup in the tub or shower regularly.  Attach bath mats securely with double-sided non-slip rug tape.  Do not have throw rugs and other things on the floor that can make you trip. What can I do in the bedroom?  Use night lights.  Make sure that you have a light by your bed that is easy to reach.  Do not use any sheets or blankets that are too big for your bed. They should not hang down onto the floor.  Have a firm chair that has side arms. You can use this for support while you get dressed.  Do not have throw rugs and other things on the floor that can make you trip. What can I do in the kitchen?  Clean up any spills right away.  Avoid walking on wet floors.  Keep items that you use a lot in easy-to-reach places.  If you need to reach something above you, use a strong step stool that has a grab bar.  Keep electrical cords out of the way.  Do not use floor polish or wax that makes floors slippery. If you must use wax,  use non-skid floor wax.  Do not have throw  rugs and other things on the floor that can make you trip. What can I do with my stairs?  Do not leave any items on the stairs.  Make sure that there are handrails on both sides of the stairs and use them. Fix handrails that are broken or loose. Make sure that handrails are as long as the stairways.  Check any carpeting to make sure that it is firmly attached to the stairs. Fix any carpet that is loose or worn.  Avoid having throw rugs at the top or bottom of the stairs. If you do have throw rugs, attach them to the floor with carpet tape.  Make sure that you have a light switch at the top of the stairs and the bottom of the stairs. If you do not have them, ask someone to add them for you. What else can I do to help prevent falls?  Wear shoes that: ? Do not have high heels. ? Have rubber bottoms. ? Are comfortable and fit you well. ? Are closed at the toe. Do not wear sandals.  If you use a stepladder: ? Make sure that it is fully opened. Do not climb a closed stepladder. ? Make sure that both sides of the stepladder are locked into place. ? Ask someone to hold it for you, if possible.  Clearly mark and make sure that you can see: ? Any grab bars or handrails. ? First and last steps. ? Where the edge of each step is.  Use tools that help you move around (mobility aids) if they are needed. These include: ? Canes. ? Walkers. ? Scooters. ? Crutches.  Turn on the lights when you go into a dark area. Replace any light bulbs as soon as they burn out.  Set up your furniture so you have a clear path. Avoid moving your furniture around.  If any of your floors are uneven, fix them.  If there are any pets around you, be aware of where they are.  Review your medicines with your doctor. Some medicines can make you feel dizzy. This can increase your chance of falling. Ask your doctor what other things that you can do to help prevent  falls. This information is not intended to replace advice given to you by your health care provider. Make sure you discuss any questions you have with your health care provider. Document Released: 01/09/2009 Document Revised: 08/21/2015 Document Reviewed: 04/19/2014 Elsevier Interactive Patient Education  2018 [place] Maintenance, Male A healthy lifestyle and preventive care is important for your health and wellness. Ask your health care provider about what schedule of regular examinations is right for you. What should I know about weight and diet? Eat a Healthy Diet  Eat plenty of vegetables, fruits, whole grains, low-fat dairy products, and lean protein.  Do not eat a lot of foods high in solid fats, added sugars, or salt.  Maintain a Healthy Weight Regular exercise can help you achieve or maintain a healthy weight. You should:  Do at least 150 minutes of exercise each week. The exercise should increase your heart rate and make you sweat (moderate-intensity exercise).  Do strength-training exercises at least twice a week.  Watch Your Levels of Cholesterol and Blood Lipids  Have your blood tested for lipids and cholesterol every 5 years starting at 69 years of age. If you are at high risk for heart disease, you should start having your blood tested when you are 69 years old. You  may need to have your cholesterol levels checked more often if: ? Your lipid or cholesterol levels are high. ? You are older than 69 years of age. ? You are at high risk for heart disease.  What should I know about cancer screening? Many types of cancers can be detected early and may often be prevented. Lung Cancer  You should be screened every year for lung cancer if: ? You are a current smoker who has smoked for at least 30 years. ? You are a former smoker who has quit within the past 15 years.  Talk to your health care provider about your screening options, when you should start  screening, and how often you should be screened.  Colorectal Cancer  Routine colorectal cancer screening usually begins at 69 years of age and should be repeated every 5-10 years until you are 69 years old. You may need to be screened more often if early forms of precancerous polyps or small growths are found. Your health care provider may recommend screening at an earlier age if you have risk factors for colon cancer.  Your health care provider may recommend using home test kits to check for hidden blood in the stool.  A small camera at the end of a tube can be used to examine your colon (sigmoidoscopy or colonoscopy). This checks for the earliest forms of colorectal cancer.  Prostate and Testicular Cancer  Depending on your age and overall health, your health care provider may do certain tests to screen for prostate and testicular cancer.  Talk to your health care provider about any symptoms or concerns you have about testicular or prostate cancer.  Skin Cancer  Check your skin from head to toe regularly.  Tell your health care provider about any new moles or changes in moles, especially if: ? There is a change in a mole's size, shape, or color. ? You have a mole that is larger than a pencil eraser.  Always use sunscreen. Apply sunscreen liberally and repeat throughout the day.  Protect yourself by wearing long sleeves, pants, a wide-brimmed hat, and sunglasses when outside.  What should I know about heart disease, diabetes, and high blood pressure?  If you are 74-47 years of age, have your blood pressure checked every 3-5 years. If you are 2 years of age or older, have your blood pressure checked every year. You should have your blood pressure measured twice-once when you are at a hospital or clinic, and once when you are not at a hospital or clinic. Record the average of the two measurements. To check your blood pressure when you are not at a hospital or clinic, you can use: ? An  automated blood pressure machine at a pharmacy. ? A home blood pressure monitor.  Talk to your health care provider about your target blood pressure.  If you are between 62-56 years old, ask your health care provider if you should take aspirin to prevent heart disease.  Have regular diabetes screenings by checking your fasting blood sugar level. ? If you are at a normal weight and have a low risk for diabetes, have this test once every three years after the age of 28. ? If you are overweight and have a high risk for diabetes, consider being tested at a younger age or more often.  A one-time screening for abdominal aortic aneurysm (AAA) by ultrasound is recommended for men aged 25-75 years who are current or former smokers. What should I know about preventing  infection? Hepatitis B If you have a higher risk for hepatitis B, you should be screened for this virus. Talk with your health care provider to find out if you are at risk for hepatitis B infection. Hepatitis C Blood testing is recommended for:  Everyone born from [birth year] through 1967.  Anyone with known risk factors for hepatitis C.  Sexually Transmitted Diseases (STDs)  You should be screened each year for STDs including gonorrhea and chlamydia if: ? You are sexually active and are younger than 69 years of age. ? You are older than 69 years of age and your health care provider tells you that you are at risk for this type of infection. ? Your sexual activity has changed since you were last screened and you are at an increased risk for chlamydia or gonorrhea. Ask your health care provider if you are at risk.  Talk with your health care provider about whether you are at high risk of being infected with HIV. Your health care provider may recommend a prescription medicine to help prevent HIV infection.  What else can I do?  Schedule regular health, dental, and eye exams.  Stay current with your vaccines (immunizations).  Do not use  any tobacco products, such as cigarettes, chewing tobacco, and e-cigarettes. If you need help quitting, ask your health care provider.  Limit alcohol intake to no more than 2 drinks per day. One drink equals 12 ounces of beer, 5 ounces of wine, or 1 ounces of hard liquor.  Do not use street drugs.  Do not share needles.  Ask your health care provider for help if you need support or information about quitting drugs.  Tell your health care provider if you often feel depressed.  Tell your health care provider if you have ever been abused or do not feel safe at home. This information is not intended to replace advice given to you by your health care provider. Make sure you discuss any questions you have with your health care provider. Document Released: 09/11/2007 Document Revised: 11/12/2015 Document Reviewed: 12/17/2014 Elsevier Interactive Patient Education  [employer].

## 2017-10-06 NOTE — Telephone Encounter (Signed)
Zetia was sent in to her pharmacy. Called patient and let her know

## 2017-10-06 NOTE — Telephone Encounter (Signed)
In for AWV   Request refill on tadalafill( Cialis) Please advise  Wynetta Fines RN

## 2017-10-06 NOTE — Telephone Encounter (Signed)
Why does he need refills? Given 5 tabs with 11 refills on 07/11/17- has he used all 55 tablets? Is he using full or half pill?

## 2017-10-06 NOTE — Telephone Encounter (Signed)
Disregard last message.  

## 2017-10-06 NOTE — Progress Notes (Signed)
I have reviewed and agree with note, evaluation, plan.   Manuela Schwartz- please schedule him a follow up with me since SBP above 150. We had agreed to in office goal at least <150/90 before meds but if he remains up like that we will need to start medication.   Also when you speak to him check in on cialis- I want to make sure he knows there were 11 refills already called in earlier this year. I am not opposed to refills.   Garret Reddish, MD

## 2017-10-06 NOTE — Telephone Encounter (Signed)
Call to Mr Brawn Left a message on VM that Dr. Yong Channel did not want to fill the med requested due to it being ordered as 5 tablets,  1/2 tab qod with 11 refills and he should not be out.  Asked to call the office to fup and noted to speak to [facility], as I will not be here again until next Thursday.  Wynetta Fines

## 2017-10-06 NOTE — Telephone Encounter (Signed)
Its not that I dont want to refill it. I just want to have more information. Is he using it really frequently? Does he know he has 11 refills...etc. More info- not a rejection of medication refill

## 2017-10-07 NOTE — Telephone Encounter (Signed)
Duplicate

## 2017-10-07 NOTE — Telephone Encounter (Signed)
Spoke to pt, clarified Rx for Cialis. Pt is requesting Rx he use to take Cialis 5 mg one tablet daily PRN #30 is what Dr. Leanne Chang use to order for him. Pt has not picked up new Rx that was ordered in April by Dr. Yong Channel. Told pt I will send message and get back to him. Pt verbalized understanding.

## 2017-10-07 NOTE — Telephone Encounter (Signed)
Dr. Yong Channel please see message and advise.

## 2017-10-08 NOTE — Telephone Encounter (Signed)
You may refill cialis 5 mg prn daily #30 with 5 refills. May be more expensive like this and insurance may not pay either but we can try it.

## 2017-10-08 NOTE — Telephone Encounter (Signed)
When filling this please take other medicines viagra and larger cialis pill off med list

## 2017-10-10 ENCOUNTER — Telehealth: Payer: Self-pay

## 2017-10-10 MED ORDER — TADALAFIL 5 MG PO TABS: 5.0000 mg | ORAL_TABLET | Freq: Every day | ORAL | 5 refills | Status: DC | PRN

## 2017-10-10 NOTE — Telephone Encounter (Signed)
Spoke to pt told him Dr. Yong Channel agreed to Rx for Cialis 5 mg. Rx sent to pharmacy. Pt verbalized understanding.

## 2017-10-10 NOTE — Addendum Note (Signed)
Addended by: Marian Sorrow on: 10/10/2017 08:41 AM   Modules accepted: Orders

## 2017-10-20 ENCOUNTER — Telehealth: Payer: Self-pay

## 2017-10-20 NOTE — Telephone Encounter (Signed)
error 

## 2017-10-20 NOTE — Telephone Encounter (Signed)
Call back to Jeffery [name] x 3 Had to leave VM for him to call and make apt with Dr. Yong Channel to fup on his BP.  Left my number for direct call back as no option; [place]

## 2017-10-21 NOTE — Telephone Encounter (Signed)
Could not reach by phone. Letter out today to schedule an apt with Dr. Yong Channel to fup on his Blood pressure  Wynetta Fines RN

## 2017-12-23 ENCOUNTER — Telehealth: Payer: Self-pay | Admitting: Family Medicine

## 2017-12-23 NOTE — Telephone Encounter (Signed)
Copied from [place] [phone]. Topic: General - Other >> Dec 23, 2017 12:31 PM Mcneil, Ja-Kwan wrote: Reason for CRM: Lovina Reach with [facility] reports PAD screening was done and pt right leg was 0.81 left leg was 1.02 and results are supposed to be above 1 so the right leg is decreased. Cb# [phone]

## 2017-12-27 NOTE — Telephone Encounter (Signed)
Please contact to schedule

## 2017-12-27 NOTE — Telephone Encounter (Signed)
Schedule visit- have patient bring report please

## 2017-12-29 NOTE — Telephone Encounter (Signed)
Called pt and left vm to schedule appt

## 2018-01-23 ENCOUNTER — Encounter: Payer: Self-pay | Admitting: Gastroenterology

## 2018-05-04 DIAGNOSIS — H43813 Vitreous degeneration, bilateral: Secondary | ICD-10-CM | POA: Diagnosis not present

## 2018-12-15 ENCOUNTER — Telehealth: Payer: Self-pay | Admitting: Family Medicine

## 2018-12-15 NOTE — Telephone Encounter (Signed)
I called the patient to schedule AWV with Courtney, but there was no answer and no option to leave a message. VDM (Dee-Dee) °

## 2019-01-31 ENCOUNTER — Telehealth: Payer: Self-pay | Admitting: Family Medicine

## 2019-01-31 NOTE — Telephone Encounter (Signed)
See below

## 2019-01-31 NOTE — Telephone Encounter (Signed)
Nidhi  NP housecall is calling to report pt a1c is 5.9

## 2019-02-01 NOTE — Telephone Encounter (Signed)
Noted and abstracted.   

## 2019-03-01 ENCOUNTER — Other Ambulatory Visit: Payer: Self-pay

## 2019-03-02 ENCOUNTER — Ambulatory Visit (INDEPENDENT_AMBULATORY_CARE_PROVIDER_SITE_OTHER): Payer: Medicare Other

## 2019-03-02 VITALS — BP 136/72 | HR 52 | Temp 98.2°F | Ht 67.0 in | Wt 194.4 lb

## 2019-03-02 DIAGNOSIS — Z0000 Encounter for general adult medical examination without abnormal findings: Secondary | ICD-10-CM | POA: Diagnosis not present

## 2019-03-02 NOTE — Progress Notes (Signed)
Subjective:   Jeffery [name] is a 68 y.o. male who presents for Medicare Annual/Subsequent preventive examination.  Review of Systems:   Cardiac Risk Factors include: advanced age (>65men, >32 women);male gender;dyslipidemia    Objective:    Vitals: BP 136/72 (BP Location: Left Arm, Patient Position: Sitting, Cuff Size: Normal)   Pulse (!) 52   Temp 98.2 F (36.8 C) (Temporal)   Ht 5\' 7"  (1.702 m)   Wt 194 lb 6.4 oz (88.2 kg)   SpO2 96%   BMI 30.45 kg/m   Body mass index is 30.45 kg/m.  Advanced Directives 03/02/2019 10/06/2017 01/09/2015 10/19/2012 09/20/2012  Does Patient Have a Medical Advance Directive? Yes Yes Yes Patient has advance directive, copy not in chart Patient has advance directive, copy not in chart  Type of Advance Directive Living will;Healthcare Power of [place] will  Does patient want to make changes to medical advance directive? No - Patient declined - - - -  Copy of [place] in Chart? No - copy requested - - - Copy requested from family    Tobacco Social History   Tobacco Use  Smoking Status Never Smoker  Smokeless Tobacco Never Used     Counseling given: Not Answered   Clinical Intake:  Pre-visit preparation completed: Yes  Pain : No/denies pain  Diabetes: No  How often do you need to have someone help you when you read instructions, pamphlets, or other written materials from your doctor or pharmacy?: 1 - Never  Interpreter Needed?: No  Information entered by :: Denman George LPN  Past Medical History:  Diagnosis Date  . Cataract    cataract extraction  . Colon cancer ([place]) 08/23/2007   chemo   Past Surgical History:  Procedure Laterality Date  . CATARACT EXTRACTION W/PHACO Right 09/27/2012   Procedure: CATARACT EXTRACTION PHACO AND INTRAOCULAR LENS PLACEMENT (IOC);  Surgeon: Adonis Brook, MD;  Location: [facility];  Service: Ophthalmology;  Laterality: Right;  . CATARACT  EXTRACTION W/PHACO Left 11/01/2012   Procedure: CATARACT EXTRACTION PHACO AND INTRAOCULAR LENS PLACEMENT (IOC);  Surgeon: Adonis Brook, MD;  Location: [facility];  Service: Ophthalmology;  Laterality: Left;  . COLECTOMY  2009   colon cancer   Family History  Problem Relation Age of Onset  . Colon cancer Neg Hx   . Esophageal cancer Neg Hx   . Rectal cancer Neg Hx   . Stomach cancer Neg Hx   . Hypertension Mother   . Hypertension Father    Social History   Socioeconomic History  . Marital status: Married    Spouse name: Not on file  . Number of children: Not on file  . Years of education: Not on file  . Highest education level: Not on file  Occupational History  . Not on file  Social Needs  . Financial resource strain: Not on file  . Food insecurity    Worry: Not on file    Inability: Not on file  . Transportation needs    Medical: Not on file    Non-medical: Not on file  Tobacco Use  . Smoking status: Never Smoker  . Smokeless tobacco: Never Used  Substance and Sexual Activity  . Alcohol use: No    Alcohol/week: 0.0 standard drinks  . Drug use: No  . Sexual activity: Not on file  Lifestyle  . Physical activity    Days per week: Not on file    Minutes per  session: Not on file  . Stress: Not on file  Relationships  . Social Herbalist on phone: Not on file    Gets together: Not on file    Attends religious service: Not on file    Active member of club or organization: Not on file    Attends meetings of clubs or organizations: Not on file    Relationship status: Not on file  Other Topics Concern  . Not on file  Social History Narrative   Married. 2 sons.1 grandson.  1 greatgrandson as well, coming 2019 greatgranddaughter due.  Wife Jeffery [name]      Retired from Psychologist, educational       Hobbies: bowling, golfing      Helps to babysit great granddaughter     Outpatient Encounter Medications as of 03/02/2019  Medication Sig  . acetaminophen (TYLENOL) 500 MG tablet  Take 500 mg by mouth every 4 (four) hours as needed for pain (for occasional pain/headache).   . Multiple Vitamin (MULTIVITAMIN) tablet Take 1 tablet by mouth daily.    . tadalafil (CIALIS) 5 MG tablet Take 1 tablet (5 mg total) by mouth daily as needed for erectile dysfunction.   No facility-administered encounter medications on file as of 03/02/2019.     Activities of Daily Living In your present state of health, do you have any difficulty performing the following activities: 03/02/2019  Hearing? N  Vision? N  Difficulty concentrating or making decisions? N  Walking or climbing stairs? N  Dressing or bathing? N  Doing errands, shopping? N  Preparing Food and eating ? N  Using the Toilet? N  In the past six months, have you accidently leaked urine? N  Do you have problems with loss of bowel control? N  Managing your Medications? N  Managing your Finances? N  Housekeeping or managing your Housekeeping? N  Some recent data might be hidden    Patient Care Team: Marin Olp, MD as PCP - General (Family Medicine) Marylynn Pearson, MD as Consulting Physician (Ophthalmology)   Assessment:   This is a routine wellness examination for Jeffery [name].  Exercise Activities and Dietary recommendations Current Exercise Habits: Home exercise routine, Type of exercise: walking;Other - see comments(yardwork activities), Time (Minutes): 45, Frequency (Times/Week): 5, Weekly Exercise (Minutes/Week): 225, Intensity: Mild  Goals    . Exercise 150 min/wk Moderate Activity     Walks in the afternoons Walks 30 minutes x 2 and to increase days of the week        Fall Risk Fall Risk  03/02/2019 10/06/2017 07/11/2017 06/01/2016 01/13/2015  Falls in the past year? 0 No No No No  Injury with Fall? 0 - - - -  Follow up Falls evaluation completed;Education provided;Falls prevention discussed - - - -   Is the patient's home free of loose throw rugs in walkways, pet beds, electrical cords, etc?   yes      Grab  bars in the bathroom? yes      Handrails on the stairs?   yes      Adequate lighting?   yes  Timed Get Up and Go Performed: completed and within normal timeframe; no gait abnormalities noted   Depression Screen PHQ 2/9 Scores 03/02/2019 10/06/2017 07/11/2017 06/01/2016  PHQ - 2 Score 0 0 0 0    Cognitive Function- no cognitive concerns at this time  MMSE - Mini Mental State Exam 10/06/2017  Not completed: (No Data)     6CIT Screen 03/02/2019  What Year? 0 points  What month? 0 points  What time? 0 points  Count back from 20 0 points  Months in reverse 0 points  Repeat phrase 2 points  Total Score 2    There is no immunization history on file for this patient.   Patient declines all vaccines at this time; patient education handouts provided on importance of vaccines.   Qualifies for Shingles Vaccine? Discussed and patient will check with pharmacy for coverage.  Patient education handout provided    Screening Tests Health Maintenance  Topic Date Due  . COLONOSCOPY  01/08/2018  . INFLUENZA VACCINE  01/12/2045 (Originally 10/28/2018)  . TETANUS/TDAP  01/12/2045 (Originally 06/16/1967)  . PNA vac Low Risk Adult (1 of 2 - PCV13) 01/12/2045 (Originally 06/15/2013)  . Hepatitis C Screening  Completed   Cancer Screenings: Lung: Low Dose CT Chest recommended if Age 34-80 years, 30 pack-year currently smoking OR have quit w/in 15years. Patient does not qualify. Colorectal: recommended repeat due 12/2017; last colonoscopy 01/09/15 (patient declines at this time)      Plan:  I have personally reviewed and addressed the Medicare Annual Wellness questionnaire and have noted the following in the patient's chart:  A. Medical and social history B. Use of alcohol, tobacco or illicit drugs  C. Current medications and supplements D. Functional ability and status E.  Nutritional status F.  Physical activity G. Advance directives H. List of other physicians I.  Hospitalizations, surgeries, and  ER visits in previous 12 months J.  [place] such as hearing and vision if needed, cognitive and depression L. Referrals, records requested, and appointments- none   In addition, I have reviewed and discussed with patient certain preventive protocols, quality metrics, and best practice recommendations. A written personalized care plan for preventive services as well as general preventive health recommendations were provided to patient.   Signed,  Denman George, LPN  Nurse Health Advisor   Nurse Notes: no additional

## 2019-03-02 NOTE — Patient Instructions (Addendum)
Jeffery [name] , Thank you for taking time to come for your Medicare Wellness Visit. I appreciate your ongoing commitment to your health goals. Please review the following plan we discussed and let me know if I can assist you in the future.   Screening recommendations/referrals: Colorectal Screening: recommended repeat due 12/2017  Vision and Dental Exams: Recommended annual ophthalmology exams for early detection of glaucoma and other disorders of the eye Recommended annual dental exams for proper oral hygiene  Vaccinations: Influenza vaccine:  recommended this fall either at PCP office or through your local pharmacy  Pneumococcal vaccine: recommended 2 types Prevnar and Pneumovax 23 Tdap vaccine: recommended;  Please call your insurance company to determine your out of pocket expense. You may receive this vaccine at your local pharmacy or Health Dept. Shingles vaccine: Please call your insurance company to determine your out of pocket expense for the Shingrix vaccine. You may receive this vaccine at your local pharmacy.  Advanced directives: Please bring a copy of your POA (Power of Attorney) and/or Living Will to your next appointment.  Goals: Recommend to drink at least 6-8 8oz glasses of water per day and consume a balanced diet rich in fresh fruits and vegetables.    Next appointment: Please schedule your Annual Wellness Visit with your Nurse Health Advisor in one year.  Please schedule a follow up with Dr. Yong Channel for Physical with labs   Preventive Care 65 Years and Older, Male Preventive care refers to lifestyle choices and visits with your health care provider that can promote health and wellness. What does preventive care include?  A yearly physical exam. This is also called an annual well check.  Dental exams once or twice a year.  Routine eye exams. Ask your health care provider how often you should have your eyes checked.  Personal lifestyle choices, including:  Daily  care of your teeth and gums.  Regular physical activity.  Eating a healthy diet.  Avoiding tobacco and drug use.  Limiting alcohol use.  Practicing safe sex.  Taking low doses of aspirin every day if recommended by your health care provider..  Taking vitamin and mineral supplements as recommended by your health care provider. What happens during an annual well check? The services and screenings done by your health care provider during your annual well check will depend on your age, overall health, lifestyle risk factors, and family history of disease. Counseling  Your health care provider may ask you questions about your:  Alcohol use.  Tobacco use.  Drug use.  Emotional well-being.  Home and relationship well-being.  Sexual activity.  Eating habits.  History of falls.  Memory and ability to understand (cognition).  Work and work Statistician. Screening  You may have the following tests or measurements:  Height, weight, and BMI.  Blood pressure.  Lipid and cholesterol levels. These may be checked every 5 years, or more frequently if you are over 44 years old.  Skin check.  Lung cancer screening. You may have this screening every year starting at age 29 if you have a 30-pack-year history of smoking and currently smoke or have quit within the past 15 years.  Fecal occult blood test (FOBT) of the stool. You may have this test every year starting at age 77.  Flexible sigmoidoscopy or colonoscopy. You may have a sigmoidoscopy every 5 years or a colonoscopy every 10 years starting at age 87.  Prostate cancer screening. Recommendations will vary depending on your family history and other risks.  Hepatitis C blood test.  Hepatitis B blood test.  Sexually transmitted disease (STD) testing.  Diabetes screening. This is done by checking your blood sugar (glucose) after you have not eaten for a while (fasting). You may have this done every 1-3 years.  Abdominal  aortic aneurysm (AAA) screening. You may need this if you are a current or former smoker.  Osteoporosis. You may be screened starting at age 29 if you are at high risk. Talk with your health care provider about your test results, treatment options, and if necessary, the need for more tests. Vaccines  Your health care provider may recommend certain vaccines, such as:  Influenza vaccine. This is recommended every year.  Tetanus, diphtheria, and acellular pertussis (Tdap, Td) vaccine. You may need a Td booster every 10 years.  Zoster vaccine. You may need this after age 75.  Pneumococcal 13-valent conjugate (PCV13) vaccine. One dose is recommended after age 63.  Pneumococcal polysaccharide (PPSV23) vaccine. One dose is recommended after age 21. Talk to your health care provider about which screenings and vaccines you need and how often you need them. This information is not intended to replace advice given to you by your health care provider. Make sure you discuss any questions you have with your health care provider. Document Released: 04/11/2015 Document Revised: 12/03/2015 Document Reviewed: 01/14/2015 Elsevier Interactive Patient Education  2017 Trail Prevention in the Home Falls can cause injuries. They can happen to people of all ages. There are many things you can do to make your home safe and to help prevent falls. What can I do on the outside of my home?  Regularly fix the edges of walkways and driveways and fix any cracks.  Remove anything that might make you trip as you walk through a door, such as a raised step or threshold.  Trim any bushes or trees on the path to your home.  Use bright outdoor lighting.  Clear any walking paths of anything that might make someone trip, such as rocks or tools.  Regularly check to see if handrails are loose or broken. Make sure that both sides of any steps have handrails.  Any raised decks and porches should have  guardrails on the edges.  Have any leaves, snow, or ice cleared regularly.  Use sand or salt on walking paths during winter.  Clean up any spills in your garage right away. This includes oil or grease spills. What can I do in the bathroom?  Use night lights.  Install grab bars by the toilet and in the tub and shower. Do not use towel bars as grab bars.  Use non-skid mats or decals in the tub or shower.  If you need to sit down in the shower, use a plastic, non-slip stool.  Keep the floor dry. Clean up any water that spills on the floor as soon as it happens.  Remove soap buildup in the tub or shower regularly.  Attach bath mats securely with double-sided non-slip rug tape.  Do not have throw rugs and other things on the floor that can make you trip. What can I do in the bedroom?  Use night lights.  Make sure that you have a light by your bed that is easy to reach.  Do not use any sheets or blankets that are too big for your bed. They should not hang down onto the floor.  Have a firm chair that has side arms. You can use this for support while you  get dressed.  Do not have throw rugs and other things on the floor that can make you trip. What can I do in the kitchen?  Clean up any spills right away.  Avoid walking on wet floors.  Keep items that you use a lot in easy-to-reach places.  If you need to reach something above you, use a strong step stool that has a grab bar.  Keep electrical cords out of the way.  Do not use floor polish or wax that makes floors slippery. If you must use wax, use non-skid floor wax.  Do not have throw rugs and other things on the floor that can make you trip. What can I do with my stairs?  Do not leave any items on the stairs.  Make sure that there are handrails on both sides of the stairs and use them. Fix handrails that are broken or loose. Make sure that handrails are as long as the stairways.  Check any carpeting to make sure that  it is firmly attached to the stairs. Fix any carpet that is loose or worn.  Avoid having throw rugs at the top or bottom of the stairs. If you do have throw rugs, attach them to the floor with carpet tape.  Make sure that you have a light switch at the top of the stairs and the bottom of the stairs. If you do not have them, ask someone to add them for you. What else can I do to help prevent falls?  Wear shoes that:  Do not have high heels.  Have rubber bottoms.  Are comfortable and fit you well.  Are closed at the toe. Do not wear sandals.  If you use a stepladder:  Make sure that it is fully opened. Do not climb a closed stepladder.  Make sure that both sides of the stepladder are locked into place.  Ask someone to hold it for you, if possible.  Clearly mark and make sure that you can see:  Any grab bars or handrails.  First and last steps.  Where the edge of each step is.  Use tools that help you move around (mobility aids) if they are needed. These include:  Canes.  Walkers.  Scooters.  Crutches.  Turn on the lights when you go into a dark area. Replace any light bulbs as soon as they burn out.  Set up your furniture so you have a clear path. Avoid moving your furniture around.  If any of your floors are uneven, fix them.  If there are any pets around you, be aware of where they are.  Review your medicines with your doctor. Some medicines can make you feel dizzy. This can increase your chance of falling. Ask your doctor what other things that you can do to help prevent falls. This information is not intended to replace advice given to you by your health care provider. Make sure you discuss any questions you have with your health care provider. Document Released: 01/09/2009 Document Revised: 08/21/2015 Document Reviewed: 04/19/2014 Elsevier Interactive Patient Education  2017 [employer]. .met

## 2019-05-10 DIAGNOSIS — H43813 Vitreous degeneration, bilateral: Secondary | ICD-10-CM | POA: Diagnosis not present

## 2019-05-11 ENCOUNTER — Other Ambulatory Visit: Payer: Self-pay

## 2019-05-11 ENCOUNTER — Ambulatory Visit: Payer: Medicare Other | Attending: Internal Medicine

## 2019-05-11 DIAGNOSIS — Z23 Encounter for immunization: Secondary | ICD-10-CM

## 2019-05-11 NOTE — Progress Notes (Signed)
   Covid-19 Vaccination Clinic  Name:  Jeffery [name]    MRN: [id number] DOB: Apr 24, 1950  05/11/2019  Jeffery [name] was observed post Covid-19 immunization for 15 minutes without incidence. He was provided with Vaccine Information Sheet and instruction to access the V-Safe system.   Jeffery [name] was instructed to call 911 with any severe reactions post vaccine: Marland Kitchen Difficulty breathing  . Swelling of your face and throat  . A fast heartbeat  . A bad rash all over your body  . Dizziness and weakness    Immunizations Administered    Name Date Dose VIS Date Route   Moderna COVID-19 Vaccine 05/11/2019 10:06 AM 0.5 mL 02/27/2019 Intramuscular   Manufacturer: Moderna   Lot: [id number]   [id number]

## 2019-06-11 ENCOUNTER — Ambulatory Visit: Payer: Medicare Other | Attending: Internal Medicine

## 2019-06-11 DIAGNOSIS — Z23 Encounter for immunization: Secondary | ICD-10-CM

## 2019-06-11 NOTE — Progress Notes (Signed)
   Covid-19 Vaccination Clinic  Name:  Jeffery [name]    MRN: [id number] DOB: 29-Aug-1950  06/11/2019  Mr. Jeffery [name] was observed post Covid-19 immunization for 15 minutes without incident. He was provided with Vaccine Information Sheet and instruction to access the V-Safe system.   Mr. Jeffery [name] was instructed to call 911 with any severe reactions post vaccine: Marland Kitchen Difficulty breathing  . Swelling of face and throat  . A fast heartbeat  . A bad rash all over body  . Dizziness and weakness   Immunizations Administered    Name Date Dose VIS Date Route   Moderna COVID-19 Vaccine 06/11/2019 10:12 AM 0.5 mL 02/27/2019 Intramuscular   Manufacturer: Moderna   Lot: [id number]   [place]:[id number]

## 2019-07-04 ENCOUNTER — Ambulatory Visit (INDEPENDENT_AMBULATORY_CARE_PROVIDER_SITE_OTHER): Payer: Medicare Other | Admitting: Family Medicine

## 2019-07-04 ENCOUNTER — Other Ambulatory Visit: Payer: Self-pay

## 2019-07-04 ENCOUNTER — Encounter: Payer: Self-pay | Admitting: Family Medicine

## 2019-07-04 ENCOUNTER — Encounter: Payer: Self-pay | Admitting: Gastroenterology

## 2019-07-04 VITALS — BP 140/74 | HR 98 | Temp 98.6°F | Ht 67.0 in | Wt 192.6 lb

## 2019-07-04 DIAGNOSIS — Z0000 Encounter for general adult medical examination without abnormal findings: Secondary | ICD-10-CM | POA: Diagnosis not present

## 2019-07-04 DIAGNOSIS — E785 Hyperlipidemia, unspecified: Secondary | ICD-10-CM | POA: Diagnosis not present

## 2019-07-04 DIAGNOSIS — I10 Essential (primary) hypertension: Secondary | ICD-10-CM | POA: Diagnosis not present

## 2019-07-04 DIAGNOSIS — Z125 Encounter for screening for malignant neoplasm of prostate: Secondary | ICD-10-CM | POA: Diagnosis not present

## 2019-07-04 DIAGNOSIS — Z85038 Personal history of other malignant neoplasm of large intestine: Secondary | ICD-10-CM

## 2019-07-04 LAB — CBC WITH DIFFERENTIAL/PLATELET
Basophils Absolute: 0 10*3/uL (ref 0.0–0.1)
Basophils Relative: 0.6 % (ref 0.0–3.0)
Eosinophils Absolute: 0.3 10*3/uL (ref 0.0–0.7)
Eosinophils Relative: 5.7 % — ABNORMAL HIGH (ref 0.0–5.0)
HCT: 40.1 % (ref 39.0–52.0)
Hemoglobin: 13.2 g/dL (ref 13.0–17.0)
Lymphocytes Relative: 33.6 % (ref 12.0–46.0)
Lymphs Abs: 1.6 10*3/uL (ref 0.7–4.0)
MCHC: 32.9 g/dL (ref 30.0–36.0)
MCV: 83.5 fl (ref 78.0–100.0)
Monocytes Absolute: 0.5 10*3/uL (ref 0.1–1.0)
Monocytes Relative: 11.8 % (ref 3.0–12.0)
Neutro Abs: 2.3 10*3/uL (ref 1.4–7.7)
Neutrophils Relative %: 48.3 % (ref 43.0–77.0)
Platelets: 198 10*3/uL (ref 150.0–400.0)
RBC: 4.8 Mil/uL (ref 4.22–5.81)
RDW: 14.1 % (ref 11.5–15.5)
WBC: 4.7 10*3/uL (ref 4.0–10.5)

## 2019-07-04 LAB — LIPID PANEL
Cholesterol: 229 mg/dL — ABNORMAL HIGH (ref 0–200)
HDL: 41.9 mg/dL (ref >39.00)
LDL Cholesterol: 164 mg/dL — ABNORMAL HIGH (ref 0–99)
NonHDL: 187.2
Total CHOL/HDL Ratio: 5
Triglycerides: 118 mg/dL (ref 0.0–149.0)
VLDL: 23.6 mg/dL (ref 0.0–40.0)

## 2019-07-04 LAB — COMPREHENSIVE METABOLIC PANEL
ALT: 14 U/L (ref 0–53)
AST: 22 U/L (ref 0–37)
Albumin: 4.7 g/dL (ref 3.5–5.2)
Alkaline Phosphatase: 44 U/L (ref 39–117)
BUN: 19 mg/dL (ref 6–23)
CO2: 28 mEq/L (ref 19–32)
Calcium: 9.2 mg/dL (ref 8.4–10.5)
Chloride: 105 mEq/L (ref 96–112)
Creatinine, Ser: 1.38 mg/dL (ref 0.40–1.50)
GFR: 61.45 mL/min (ref >60.00)
Glucose, Bld: 102 mg/dL — ABNORMAL HIGH (ref 70–99)
Potassium: 4.2 mEq/L (ref 3.5–5.1)
Sodium: 139 mEq/L (ref 135–145)
Total Bilirubin: 0.5 mg/dL (ref 0.2–1.2)
Total Protein: 7.3 g/dL (ref 6.0–8.3)

## 2019-07-04 LAB — PSA: PSA: 0.93 ng/mL (ref 0.10–4.00)

## 2019-07-04 MED ORDER — TADALAFIL 5 MG PO TABS: 5.0000 mg | ORAL_TABLET | Freq: Every day | ORAL | 5 refills | Status: DC | PRN

## 2019-07-04 NOTE — Patient Instructions (Addendum)
We will call you within two weeks about your referral to GI. If you do not hear within 3 weeks, give [place] a call.   Consider checking blood pressure at home at least once a month and making sure less than 150/90 if we arent going to check in at 6 months   Please stop by lab before you go If you do not have mychart- we will call you about results within 5 business days of [place] receiving them.  If you have mychart- we will send your results within 3 business days of [place] receiving them.  If abnormal or we want to clarify a result, we will call or mychart you to make sure you receive the message.  If you have questions or concerns or don't hear within 5 business days, please send [place] a message or call us.   Recommended follow up: Return in about 1 year (around 07/03/2020) for physical or sooner if needed.

## 2019-07-04 NOTE — Progress Notes (Signed)
Phone: [phone]   Subjective:  Patient presents today for their annual physical. Chief complaint-noted.   See problem oriented charting- Review of Systems  Constitutional: Negative for chills and fever.  HENT: Negative for ear pain, hearing loss and tinnitus.   Eyes: Negative for blurred vision, double vision and photophobia.  Respiratory: Negative for cough, shortness of breath and wheezing.   Cardiovascular: Negative for chest pain, palpitations and leg swelling.  Gastrointestinal: Negative for constipation, diarrhea, heartburn, nausea and vomiting.  Genitourinary: Negative for dysuria, frequency and urgency.  Musculoskeletal: Negative for back pain, joint pain and neck pain.  Skin: Negative for itching and rash.  Neurological: Negative for dizziness, seizures, weakness and headaches.  Endo/Heme/Allergies: Positive for environmental allergies.  Psychiatric/Behavioral: Negative for depression, memory loss and suicidal ideas. The patient does not have insomnia.    The following were reviewed and entered/updated in epic: Past Medical History:  Diagnosis Date  . Cataract    cataract extraction  . Colon cancer ([place]) 08/23/2007   chemo   Patient Active Problem List   Diagnosis Date Noted  . Hyperlipidemia 04/08/2015    Priority: Medium  . History of colon cancer 11/05/2009    Priority: Medium  . Hypertension 11/24/2006    Priority: Medium  . ERECTILE DYSFUNCTION 11/24/2006   Past Surgical History:  Procedure Laterality Date  . CATARACT EXTRACTION W/PHACO Right 09/27/2012   Procedure: CATARACT EXTRACTION PHACO AND INTRAOCULAR LENS PLACEMENT (IOC);  Surgeon: Adonis Brook, MD;  Location: [facility];  Service: Ophthalmology;  Laterality: Right;  . CATARACT EXTRACTION W/PHACO Left 11/01/2012   Procedure: CATARACT EXTRACTION PHACO AND INTRAOCULAR LENS PLACEMENT (IOC);  Surgeon: Adonis Brook, MD;  Location: [facility];  Service: Ophthalmology;  Laterality: Left;  . COLECTOMY  2009   colon  cancer    Family History  Problem Relation Age of Onset  . Colon cancer Neg Hx   . Esophageal cancer Neg Hx   . Rectal cancer Neg Hx   . Stomach cancer Neg Hx   . Hypertension Mother   . Hypertension Father     Medications- reviewed and updated Current Outpatient Medications  Medication Sig Dispense Refill  . acetaminophen (TYLENOL) 500 MG tablet Take 500 mg by mouth every 4 (four) hours as needed for pain (for occasional pain/headache).     . Multiple Vitamin (MULTIVITAMIN) tablet Take 1 tablet by mouth daily.      . tadalafil (CIALIS) 5 MG tablet Take 1 tablet (5 mg total) by mouth daily as needed for erectile dysfunction. 30 tablet 5   No current facility-administered medications for this visit.    Allergies-reviewed and updated No Known Allergies  Social History   Social History Narrative   Married. 2 sons.1 grandson.  1 greatgrandson as well, coming 2019 greatgranddaughter due.  Wife bertha      Retired from Software engineer: bowling, golfing      Helps to [employer] great granddaughter    Objective  Objective:  BP 140/74   Pulse 98   Temp 98.6 F (37 C) (Temporal)   Ht 5\' 7"  (1.702 m)   Wt 192 lb 9.6 oz (87.4 kg)   SpO2 98%   BMI 30.17 kg/m  Gen: NAD, resting comfortably HEENT: Mucous membranes are moist. Oropharynx normal Neck: no thyromegaly CV: RRR no murmurs rubs or gallops Lungs: CTAB no crackles, wheeze, rhonchi Abdomen: soft/nontender/nondistended/normal bowel sounds. No rebound or guarding.  Ext: no edema Skin: warm, dry Neuro: grossly normal, moves all  extremities, PERRLA    Assessment and Plan  69 y.o. male presenting for annual physical.  Health Maintenance counseling: 1. Anticipatory guidance: Patient counseled regarding regular dental exams q6 months, eye exams yearly,  avoiding smoking and second hand smoke , limiting alcohol to 2 beverages per day . Does not drink    2. Risk factor reduction:  Advised patient of need for  regular exercise and diet rich and fruits and vegetables to reduce risk of heart attack and stroke. Exercise- Has been increasing walking and basketball. Able to go more now Covid is getting better. Stays busy with 31 year old grand daughter. Diet-Eats lots of fresh foods. Brother works for [employer] so gets fresh vegetables . Down 7 lbs from July 2019. Discussed trying to stay at current weight or under 190 on our scales Wt Readings from Last 3 Encounters:  07/04/19 192 lb 9.6 oz (87.4 kg)  03/02/19 194 lb 6.4 oz (88.2 kg)  10/06/17 199 lb (90.3 kg)  3. Immunizations/screenings/ancillary studies-discussed Tdap (declines unless gets cut or scrape)and Shingrix- declines. Also declines pneumonia shots.   Immunization History  Administered Date(s) Administered  . Moderna SARS-COVID-2 Vaccination 05/11/2019, 06/11/2019  4. Prostate cancer screening- we discussed typically would screen 55-70 but we missed his 69 year old physical due to covid- we will get at least one final check  Lab Results  Component Value Date   PSA 0.80 07/11/2017   PSA 1.11 06/01/2016   PSA 0.75 01/13/2015   5. Colon cancer screening -patient with colon cancer and subtotal colectomy May 2009.  Also completed adjuvant chemotherapy December 2009.Planned flexible sigmoidoscopy follow-up was noted in 2019 but does not appear this was completed- refer to GI today 6. Skin cancer screening- never followed by Dermatology- lower risk due to melanin content . Advised regular sunscreen use. Denies worrisome, changing, or new skin lesions.  7. Never smoker  Status of chronic or acute concerns   #Essential hypertension-patient would prefer to remain off medicine as long as he is under JNC 8 goal-blood pressure under goal 150/90 today-continue without medication.  Continue exercise and healthy eating efforts  #hyperlipidemia S: Not on statin.  10-year ASCVD risk based off last lipids and current blood pressure is 15.3%-we discussed how  generally medication should be considered above 7.5% and started if above 20% risk Lab Results  Component Value Date   CHOL 217 (H) 07/11/2017   HDL 41.20 07/11/2017   LDLCALC 138 (H) 07/11/2017   LDLDIRECT 100.0 06/01/2016   TRIG 189.0 (H) 07/11/2017   CHOLHDL 5 07/11/2017   A/P: patient prefers to wait until 20% cut off before starting statin- we will update lipids today    History of colon cancer- refer back to GI as above  ED- cialis helpful- refill today  Allergies- fights through/prefers to tolerate  Recommended follow up: Return in about 1 year (around 07/03/2020) for physical or sooner if needed.  Lab/Order associations: fasting   ICD-10-CM   1. Preventative health care  Z00.00 tadalafil (CIALIS) 5 MG tablet    CBC with Differential/Platelet    Comprehensive metabolic panel    Lipid panel    PSA  2. Essential hypertension  I10   3. Hyperlipidemia, unspecified hyperlipidemia type  E78.5 CBC with Differential/Platelet    Comprehensive metabolic panel    Lipid panel  4. History of colon cancer  Z85.038 Ambulatory referral to Gastroenterology  5. Screening for prostate cancer  Z12.5 PSA    Meds ordered this encounter  Medications  .  tadalafil (CIALIS) 5 MG tablet    Sig: Take 1 tablet (5 mg total) by mouth daily as needed for erectile dysfunction.    Dispense:  30 tablet    Refill:  5    Return precautions advised.  Garret Reddish, MD

## 2019-07-25 ENCOUNTER — Ambulatory Visit (AMBULATORY_SURGERY_CENTER): Payer: Self-pay | Admitting: *Deleted

## 2019-07-25 ENCOUNTER — Other Ambulatory Visit: Payer: Self-pay

## 2019-07-25 VITALS — Temp 97.5°F | Ht 67.0 in | Wt 193.0 lb

## 2019-07-25 DIAGNOSIS — Z85038 Personal history of other malignant neoplasm of large intestine: Secondary | ICD-10-CM

## 2019-07-25 NOTE — Progress Notes (Signed)
Patient is here in-person for PV. Patient denies any allergies to eggs or soy. Patient denies any problems with anesthesia/sedation. Patient denies any oxygen use at home. Patient denies taking any diet/weight loss medications or blood thinners. Patient is not being treated for MRSA or C-diff. Patient is aware of our care-partner policy and 0000000 safety protocol.   Completed covid vaccines per pt on 06/11/19.

## 2019-08-08 ENCOUNTER — Other Ambulatory Visit: Payer: Self-pay

## 2019-08-08 ENCOUNTER — Encounter: Payer: Self-pay | Admitting: Gastroenterology

## 2019-08-08 ENCOUNTER — Ambulatory Visit (AMBULATORY_SURGERY_CENTER): Payer: Medicare Other | Admitting: Gastroenterology

## 2019-08-08 VITALS — BP 136/64 | HR 45 | Temp 96.9°F | Resp 11 | Ht 67.0 in | Wt 193.0 lb

## 2019-08-08 DIAGNOSIS — Z85038 Personal history of other malignant neoplasm of large intestine: Secondary | ICD-10-CM | POA: Diagnosis not present

## 2019-08-08 MED ORDER — SODIUM CHLORIDE 0.9 % IV SOLN: 500.0000 mL | INTRAVENOUS | Status: DC

## 2019-08-08 NOTE — Progress Notes (Signed)
A/ox3, pleased with MAC, report to RN 

## 2019-08-08 NOTE — Patient Instructions (Signed)
YOU HAD AN ENDOSCOPIC PROCEDURE TODAY AT THE [facility]:   Refer to the procedure report that was given to you for any specific questions about what was found during the examination.  If the procedure report does not answer your questions, please call your gastroenterologist to clarify.  If you requested that your care partner not be given the details of your procedure findings, then the procedure report has been included in a sealed envelope for you to review at your convenience later.  YOU SHOULD EXPECT: Some feelings of bloating in the abdomen. Passage of more gas than usual.  Walking can help get rid of the air that was put into your GI tract during the procedure and reduce the bloating. If you had a lower endoscopy (such as a colonoscopy or flexible sigmoidoscopy) you may notice spotting of blood in your stool or on the toilet paper. If you underwent a bowel prep for your procedure, you may not have a normal bowel movement for a few days.  Please Note:  You might notice some irritation and congestion in your nose or some drainage.  This is from the oxygen used during your procedure.  There is no need for concern and it should clear up in a day or so.  SYMPTOMS TO REPORT IMMEDIATELY:   Following lower endoscopy (colonoscopy or flexible sigmoidoscopy):  Excessive amounts of blood in the stool  Significant tenderness or worsening of abdominal pains  Swelling of the abdomen that is new, acute  Fever of 100F or higher  For urgent or emergent issues, a gastroenterologist can be reached at any hour by calling [phone]. Do not use MyChart messaging for urgent concerns.    DIET:  We do recommend a small meal at first, but then you may proceed to your regular diet.  Drink plenty of fluids but you should avoid alcoholic beverages for 24 hours.  ACTIVITY:  You should plan to take it easy for the rest of today and you should NOT DRIVE or use heavy machinery until tomorrow (because  of the sedation medicines used during the test).    FOLLOW UP: Our staff will call the number listed on your records 48-72 hours following your procedure to check on you and address any questions or concerns that you may have regarding the information given to you following your procedure. If we do not reach you, we will leave a message.  We will attempt to reach you two times.  During this call, we will ask if you have developed any symptoms of COVID 19. If you develop any symptoms (ie: fever, flu-like symptoms, shortness of breath, cough etc.) before then, please call [phone].  If you test positive for Covid 19 in the 2 weeks post procedure, please call and report this information to us.    If any biopsies were taken you will be contacted by phone or by letter within the next 1-3 weeks.  Please call us at [phone] if you have not heard about the biopsies in 3 weeks.    SIGNATURES/CONFIDENTIALITY: You and/or your care partner have signed paperwork which will be entered into your electronic medical record.  These signatures attest to the fact that that the information above on your After Visit Summary has been reviewed and is understood.  Full responsibility of the confidentiality of this discharge information lies with you and/or your care-partner. 

## 2019-08-10 ENCOUNTER — Telehealth: Payer: Self-pay | Admitting: *Deleted

## 2019-08-10 ENCOUNTER — Telehealth: Payer: Self-pay

## 2019-08-10 NOTE — Telephone Encounter (Signed)
Message left

## 2019-08-10 NOTE — Telephone Encounter (Signed)
LVM

## 2019-10-29 ENCOUNTER — Telehealth: Payer: Self-pay | Admitting: Family Medicine

## 2019-10-29 NOTE — Progress Notes (Signed)
  Chronic Care Management   Outreach Note  10/29/2019 Name: Jeffery [name] MRN: [id number] DOB: Apr 11, 1950  Referred by: Marin Olp, MD Reason for referral : No chief complaint on file.   An unsuccessful telephone outreach was attempted today. The patient was referred to the pharmacist for assistance with care management and care coordination.   Follow Up Plan:   Earney Hamburg Upstream Scheduler

## 2019-11-16 ENCOUNTER — Telehealth: Payer: Self-pay | Admitting: Family Medicine

## 2019-11-16 NOTE — Progress Notes (Signed)
  Chronic Care Management   Outreach Note  11/16/2019 Name: DAWN KIPER MRN: [id number] DOB: 1950/06/24  Referred by: Marin Olp, MD Reason for referral : No chief complaint on file.   An unsuccessful telephone outreach was attempted today. The patient was referred to the pharmacist for assistance with care management and care coordination.   Follow Up Plan:   Earney Hamburg Upstream Scheduler

## 2019-11-27 ENCOUNTER — Telehealth: Payer: Self-pay | Admitting: Family Medicine

## 2019-11-27 NOTE — Progress Notes (Signed)
  Chronic Care Management   Outreach Note  11/27/2019 Name: Jeffery [name] MRN: [id number] DOB: June 30, 1950  Referred by: Marin Olp, MD Reason for referral : No chief complaint on file.   An unsuccessful telephone outreach was attempted today. The patient was referred to the pharmacist for assistance with care management and care coordination.   Follow Up Plan:   Earney Hamburg Upstream Scheduler

## 2020-01-10 DIAGNOSIS — Z961 Presence of intraocular lens: Secondary | ICD-10-CM | POA: Diagnosis not present

## 2020-02-14 ENCOUNTER — Ambulatory Visit: Payer: Medicare Other | Attending: Internal Medicine

## 2020-02-14 DIAGNOSIS — Z23 Encounter for immunization: Secondary | ICD-10-CM

## 2020-02-14 NOTE — Progress Notes (Signed)
   Covid-19 Vaccination Clinic  Name:  KACPER CARTLIDGE    MRN: [id number] DOB: May 03, 1950  02/14/2020  Mr. Rieves was observed post Covid-19 immunization for 15 minutes without incident. He was provided with Vaccine Information Sheet and instruction to access the V-Safe system.   Mr. Horn was instructed to call 911 with any severe reactions post vaccine: Marland Kitchen Difficulty breathing  . Swelling of face and throat  . A fast heartbeat  . A bad rash all over body  . Dizziness and weakness   Immunizations Administered    No immunizations on file.

## 2020-03-31 ENCOUNTER — Other Ambulatory Visit: Payer: Self-pay

## 2020-03-31 ENCOUNTER — Ambulatory Visit: Payer: Medicare Other

## 2020-04-23 ENCOUNTER — Ambulatory Visit (INDEPENDENT_AMBULATORY_CARE_PROVIDER_SITE_OTHER): Payer: Medicare HMO | Admitting: Family Medicine

## 2020-04-23 ENCOUNTER — Ambulatory Visit: Payer: Self-pay

## 2020-04-23 ENCOUNTER — Other Ambulatory Visit: Payer: Self-pay

## 2020-04-23 DIAGNOSIS — M25511 Pain in right shoulder: Secondary | ICD-10-CM | POA: Diagnosis not present

## 2020-04-23 DIAGNOSIS — M7501 Adhesive capsulitis of right shoulder: Secondary | ICD-10-CM

## 2020-04-23 NOTE — Progress Notes (Signed)
I saw and examined the patient with Dr. Elouise Munroe and agree with assessment and plan as outlined.    Right shoulder pain since October, no definite injury but does a lot of repetitive activity.  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  Exam reveals frozen shoulder with pain on empty can testing, but good strength.  MSK-US reveals bursal thickening, but intact rotator cuff.  Will try home exercises, PT.  Consider glenohumeral injection if symptoms persist.

## 2020-04-23 NOTE — Progress Notes (Signed)
Office Visit Note   Patient: Jeffery [name]           Date of Birth: 07-23-1950           MRN: [id number] Visit Date: 04/23/2020 Requested by: Marin Olp, MD [place],  [place] [id number] PCP: Marin Olp, MD  Subjective: Chief Complaint  Patient presents with  . Right Shoulder - Pain    Constant pain since October 2021. He thinks it came from a combination of activities - lifting at the food pantry, cleaning out horse stalls, etc. Pain wakes him at night. Cannot raise arm up very far due to pain. The left shoulder is starting to hurt some, as he is having to use that arm more - not as bad as the right shoulder, though.    HPI: 70yo M Presenting to clinic with concerns of 3-4 months of Right shoulder pain. 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 He says that he has noticed a worsening pain and stiffness in his shoulder, 'it just feels tight.' He is worried he may have torn his rotator cuff, though he has had no overt trauma. He says he is otherwise in good health, with no additional concerns. No numbness/tingling in his hand.               ROS:   All other systems were reviewed and are negative.  Objective: Vital Signs: There were no vitals taken for this visit.  Physical Exam:  General:  Alert and oriented, in no acute distress. Pulm:  Breathing unlabored. Psy:  Normal mood, congruent affect. Skin:  Right shoulder with no bruising, rashes, or erythema. Overlying skin intact.   Right Shoulder Exam:  Inspection: Symmetric muscle mass, no atrophy or deformity, no scars. Palpation: No tenderness to palpation over and around the acromion, over the [facility] joint or biceps insertion.  Range of motion: ROM reduced in all fields in both passive and active testing. Unable to Abduct or flex beyond 90. External  rotation limited to approximately 30*, as is internal rotation. Apply scratch only to lower sacrum.   Rotator cuff testing:  Somewhat reduced strength as well as pain with empty can (supraspinatus).  External rotation and internal rotation with full strength and however endorses pain.  Biceps testing: Negative speeds.  Labral testing: O'Brien's/speeds with full strength, though somewhat painful.  Strength testing:  5 out of 5 strength with wrist extension (C6), wrist flexion (C7), grip strength (C8), and finger abduction (T1).  Sensation: Intact to light touch throughout bilateral upper extremities.   Brisk distal capillary refill.   Imaging: Extremity Ultrasound- Right Shoulder Biceps tendon visualized in long and short axis, with no significant surrounding fluid.  Fibers appear intact. Subscapularis tendon intact, with no significant surrounding effusion. Supraspinatus appears intact, no significant cortical irregularities, effusions, or intratendinous defects. Does have thickening and hyperechoic changes of subacromial bursa. Infraspinatus and teres minor both appear intact, without surrounding fluid. AC joint visualized, no significant bony spurring.  Negative geyser sign. Glenohumeral joint visualized without significant bony spurring or effusion.  No obvious posterior labral pathology.   Assessment & Plan: 70yo M presenting to clinic with concerns of 3-4 months of right shoulder pain. ROM with significant reduction with both active and passive testing, and [place] with RTC intact, raising concern for frozen shoulder.  -  Will start treatment with physical therapy. If this does not offer improvement, patient is to return to clinic for consideration of injection therapy. - Patient expresses understanding, and has no further questions or concerns today.      Procedures: No procedures performed        PMFS History: Patient Active Problem List   Diagnosis Date Noted  .  Hyperlipidemia 04/08/2015  . History of colon cancer 11/05/2009  . ERECTILE DYSFUNCTION 11/24/2006  . Hypertension 11/24/2006   Past Medical History:  Diagnosis Date  . Cataract    cataract extraction  . Colon cancer ([place]) 08/23/2007   chemo    Family History  Problem Relation Age of Onset  . Hypertension Mother   . Hypertension Father   . Colon cancer Neg Hx   . Esophageal cancer Neg Hx   . Rectal cancer Neg Hx   . Stomach cancer Neg Hx   . Colon polyps Neg Hx     Past Surgical History:  Procedure Laterality Date  . CATARACT EXTRACTION W/PHACO Right 09/27/2012   Procedure: CATARACT EXTRACTION PHACO AND INTRAOCULAR LENS PLACEMENT (IOC);  Surgeon: Adonis Brook, MD;  Location: [facility];  Service: Ophthalmology;  Laterality: Right;  . CATARACT EXTRACTION W/PHACO Left 11/01/2012   Procedure: CATARACT EXTRACTION PHACO AND INTRAOCULAR LENS PLACEMENT (IOC);  Surgeon: Adonis Brook, MD;  Location: [facility];  Service: Ophthalmology;  Laterality: Left;  . COLECTOMY  2009   colon cancer  . SIGMOIDOSCOPY  01/09/2015   Social History   Occupational History  . Not on file  Tobacco Use  . Smoking status: Never Smoker  . Smokeless tobacco: Never Used  Vaping Use  . Vaping Use: Never used  Substance and Sexual Activity  . Alcohol use: No    Alcohol/week: 0.0 standard drinks  . Drug use: No  . Sexual activity: Not on file

## 2020-05-02 ENCOUNTER — Ambulatory Visit: Payer: Medicare HMO | Admitting: Rehabilitative and Restorative Service Providers"

## 2020-05-02 ENCOUNTER — Encounter: Payer: Self-pay | Admitting: Rehabilitative and Restorative Service Providers"

## 2020-05-02 ENCOUNTER — Other Ambulatory Visit: Payer: Self-pay

## 2020-05-02 DIAGNOSIS — G8929 Other chronic pain: Secondary | ICD-10-CM | POA: Diagnosis not present

## 2020-05-02 DIAGNOSIS — R293 Abnormal posture: Secondary | ICD-10-CM

## 2020-05-02 DIAGNOSIS — M25611 Stiffness of right shoulder, not elsewhere classified: Secondary | ICD-10-CM | POA: Diagnosis not present

## 2020-05-02 DIAGNOSIS — M25511 Pain in right shoulder: Secondary | ICD-10-CM

## 2020-05-02 NOTE — Patient Instructions (Signed)
Access Code: [email or web address] URL: [email or web address] Date: 05/02/2020 Prepared by: Vista Mink  Exercises Supine Scapular Protraction in Flexion with Dumbbells - 2-3 x daily - 7 x weekly - 1 sets - 20 reps - 3 seconds hold Standing Scapular Retraction - 5 x daily - 7 x weekly - 1 sets - 5 reps - 5 second hold Standing Shoulder Posterior Capsule Stretch - 2-3 x daily - 7 x weekly - 1 sets - 10 reps - 10 seconds hold Supine Shoulder Internal Rotation Stretch - 2-3 x daily - 7 x weekly - 1 sets - 20 reps - 10 secondsinutes hold Supine Shoulder External Rotation Stretch - 2-3 x daily - 7 x weekly - 1 sets - 20 reps - 10 seconds hold

## 2020-05-02 NOTE — Therapy (Addendum)
[facility] [address] [place], [place], [address] Phone: [phone]   Fax:  [phone]  Physical Therapy Evaluation/Discharge  Patient Details  Name: Jeffery [name] MRN: [id number] Date of Birth: [birth year] Referring Provider (PT): Eunice Blase MD  Referring diagnosis? M75.01 Treatment diagnosis? (if different than referring diagnosis) M25.611  M25.511  R29.3 What was this (referring dx) caused by? '[]'  Surgery '[]'  Fall '[]'  Ongoing issue '[]'  Arthritis '[x]'  Other: ____Insidious onset________  Laterality: '[x]'  Rt '[]'  Lt '[]'  Both  Check all possible CPT codes:      '[x]'  97110 (Therapeutic Exercise)  '[]'  92507 (SLP Treatment)  '[x]'  62703 (Neuro Re-ed)   '[]'  92526 (Swallowing Treatment)   '[]'  97116 (Gait Training)   '[]'  [id number] (Cognitive Training, 1st 15 minutes) '[x]'  97140 (Manual Therapy)   '[]'  97130 (Cognitive Training, each add'l 15 minutes)  '[x]'  97530 (Therapeutic Activities)  '[]'  Other, List CPT Code ____________    '[x]'  50093 (Self Care)       '[x]'  All codes above (97110 - 97535)  '[]'  97012 (Mechanical Traction)  '[]'  97014 (E-stim Unattended)  '[]'  97032 (E-stim manual)  '[]'  97033 (Ionto)  '[]'  97035 (Ultrasound)  '[]'  97760 (Orthotic Fit) '[x]'  97750 (Physical Performance Training) '[]'  [id number] (Aquatic Therapy) '[]'  97034 (Contrast Bath) '[]'  97018 (Paraffin) '[]'  97597 (Wound Care 1st 20 sq cm) '[]'  97598 (Wound Care each add'l 20 sq cm) '[]'  97016 (Vasopneumatic Device) '[]'  [id number] Comptroller) '[]'  [id number] (Prosthetic Training)  Encounter Date: 05/02/2020   PT End of Session - 05/02/20 1306     Visit Number 1    Number of Visits 16    Date for PT Re-Evaluation 06/27/20    PT Start Time 1145    PT Stop Time 1225    PT Time Calculation (min) 40 min    Activity Tolerance Patient tolerated treatment well;No increased pain    Behavior During Therapy [facility] for tasks assessed/performed             Past Medical History:  Diagnosis Date   Cataract     cataract extraction   Colon cancer ([place]) 08/23/2007   chemo    Past Surgical History:  Procedure Laterality Date   CATARACT EXTRACTION W/PHACO Right 09/27/2012   Procedure: CATARACT EXTRACTION PHACO AND INTRAOCULAR LENS PLACEMENT ([place]);  Surgeon: Adonis Brook, MD;  Location: [place];  Service: Ophthalmology;  Laterality: Right;   CATARACT EXTRACTION W/PHACO Left 11/01/2012   Procedure: CATARACT EXTRACTION PHACO AND INTRAOCULAR LENS PLACEMENT ([facility]);  Surgeon: Adonis Brook, MD;  Location: [place];  Service: Ophthalmology;  Laterality: Left;   COLECTOMY  2009   colon cancer   SIGMOIDOSCOPY  01/09/2015    There were no vitals filed for this visit.    Subjective Assessment - 05/02/20 1303     Subjective Jeffery [name] notes increasin R shoulder pain since August 2021 with no particular cause.  MD diagnosis of adhesive capsulitis.    Limitations Other (comment)   Reaching and R UE use   Patient Stated Goals Return R shoulder AROM and function to normal    Currently in Pain? No/denies    Multiple Pain Sites No                [facility] PT Assessment - 05/02/20 0001       Assessment   Medical Diagnosis R shoulder pain    Referring Provider (PT) Eunice Blase MD    Onset Date/Surgical Date --   August of 2021     Balance  Screen   Has the patient fallen in the past 6 months No    Has the patient had a decrease in activity level because of a fear of falling?  No    Is the patient reluctant to leave their home because of a fear of falling?  No      Prior Function   Level of Independence Independent      Cognition   Overall Cognitive Status Within Functional Limits for tasks assessed      Observation/Other Assessments   Focus on Therapeutic Outcomes (FOTO)  53 (Goal 68)      ROM / Strength   AROM / PROM / Strength AROM;Strength      AROM   Overall AROM  Deficits    AROM Assessment Site Shoulder    Right/Left Shoulder Left;Right    Right Shoulder Flexion 105 Degrees    Right Shoulder  Internal Rotation 30 Degrees    Right Shoulder External Rotation 40 Degrees    Right Shoulder Horizontal  ADduction 10 Degrees    Left Shoulder Flexion 150 Degrees    Left Shoulder Internal Rotation 40 Degrees    Left Shoulder External Rotation 75 Degrees    Left Shoulder Horizontal ADduction 35 Degrees      Strength   Overall Strength Deficits    Strength Assessment Site Shoulder    Right/Left Shoulder Left;Right    Right Shoulder Internal Rotation 5/5    Right Shoulder External Rotation 5/5    Left Shoulder Internal Rotation 5/5    Left Shoulder External Rotation 4+/5                        Objective measurements completed on examination: See above findings.       [facility] Adult PT Treatment/Exercise - 05/02/20 0001       Exercises   Exercises Shoulder      Shoulder Exercises: Supine   Protraction AROM;Strengthening;Right;20 reps;Other (comment)    Protraction Limitations 3 seconds    External Rotation AROM;Right;10 reps;Other (comment)    External Rotation Limitations 10 seconds    Internal Rotation AROM;Right;10 reps;Other (comment)    Internal Rotation Limitations 10 seconds      Shoulder Exercises: Standing   Retraction Strengthening;Both;10 reps;Other (comment)    Retraction Limitations 5 seconds (shoulder blade pinches)    Other Standing Exercises Posterior capsule stretch 10X 10 seconds                    PT Education - 05/02/20 1304     Education Details Reviewed exam findings and discussed adhesive capsulitis diagnosis.  Reviewed starter HEP.    Person(s) Educated Patient    Methods Explanation;Demonstration;Tactile cues;Verbal cues;Handout    Comprehension Verbalized understanding;Tactile cues required;Need further instruction;Returned demonstration;Verbal cues required                 PT Long Term Goals - 05/02/20 1309       PT LONG TERM GOAL #1   Title Improve FOTO score to 68.    Baseline 53    Time 8    Period  Weeks    Status New    Target Date 06/27/20      PT LONG TERM GOAL #2   Title Improve R shoulder AROM for flexion to 170; ER to 90; IR to 60 and HA to 40 degrees.    Baseline See objective (105; 40; 30; 10).    Time 8  Period Weeks    Status New    Target Date 06/27/20      PT LONG TERM GOAL #3   Title Jeffery [name] will report no R shoulder pain.    Baseline Can be a sharp 7/10 with movement, 0/10 baseline.    Time 8    Period Weeks    Status New    Target Date 06/27/20      PT LONG TERM GOAL #4   Title Jeffery [name] will be independent with a long-term maintenence HEP at DC.    Time 8    Period Weeks    Status New    Target Date 06/27/20                    Plan - 05/02/20 1242     Clinical Impression Statement Jeffery [name] has R shoulder pain (with activity) and stiffness dating back to August 2021.  Strength is very good.  AROM is very limited due to capsular tightness.  Capsular stretching will be the focus of Jeffery [name]'s physical therapy.    Examination-Activity Limitations Bathing;Dressing;Sleep;Carry;Reach Overhead    Examination-Participation Restrictions Interpersonal Relationship;Volunteer;Community Activity    Stability/Clinical Decision Making Stable/Uncomplicated    Clinical Decision Making Low    Rehab Potential Good    PT Frequency 2x / week    PT Duration 8 weeks    PT Treatment/Interventions ADLs/Self Care Home Management;Moist Heat;Cryotherapy;Therapeutic activities;Therapeutic exercise;Neuromuscular re-education;Patient/family education;Manual techniques;Passive range of motion;Vasopneumatic Device;Joint Manipulations    PT Next Visit Plan Capsular stretching    PT Home Exercise Plan Access Code: [email or web address]    Consulted and Agree with Plan of Care Patient             Patient will benefit from skilled therapeutic intervention in order to improve the following deficits and impairments:  Decreased range of motion,Decreased mobility,Hypomobility,Increased  edema,Impaired flexibility,Impaired UE functional use,Pain  Visit Diagnosis: Abnormal posture  Stiffness of right shoulder, not elsewhere classified  Chronic right shoulder pain     Problem List Patient Active Problem List   Diagnosis Date Noted   Hyperlipidemia 04/08/2015   History of colon cancer 11/05/2009   ERECTILE DYSFUNCTION 11/24/2006   Hypertension 11/24/2006    Jeffery [name] PT, MPT 05/02/2020, 1:16 PM  PHYSICAL THERAPY DISCHARGE SUMMARY  Visits from Start of Care: 1  Current functional level related to goals / functional outcomes: See note    Remaining deficits: See note   Education / Equipment: HEP   Patient agrees to discharge. Patient goals were not met. Patient is being discharged due to not returning since the last visit.  Jeffery [name], PT, DPT, OCS, ATC 09/15/20  10:14 AM     [facility] [address] [place], [place], [address] Phone: [phone]   Fax:  [phone]  Name: Jeffery [name] MRN: [id number] Date of Birth: 1950/07/23

## 2020-06-17 ENCOUNTER — Telehealth: Payer: Self-pay | Admitting: Family Medicine

## 2020-06-17 NOTE — Telephone Encounter (Signed)
Left message for patient to call back and schedule Medicare Annual Wellness Visit (AWV) either virtually OR in office.   Last AWV 03/04/19; please schedule at anytime with [facility]Nurse Health Advisor at [facility].  This should be a 45 minute visit.

## 2020-07-09 ENCOUNTER — Encounter: Payer: Medicare Other | Admitting: Family Medicine

## 2020-10-08 DIAGNOSIS — H26491 Other secondary cataract, right eye: Secondary | ICD-10-CM | POA: Diagnosis not present

## 2020-10-17 DIAGNOSIS — H26491 Other secondary cataract, right eye: Secondary | ICD-10-CM | POA: Diagnosis not present

## 2020-11-11 ENCOUNTER — Encounter: Payer: Medicare HMO | Admitting: Family Medicine

## 2020-12-15 ENCOUNTER — Telehealth: Payer: Self-pay

## 2020-12-15 NOTE — Telephone Encounter (Signed)
Nurse Assessment Nurse: Thad Ranger RN, Denise Date/Time (Eastern Time): 12/15/2020 1:03:21 PM Confirm and document reason for call. If symptomatic, describe symptoms. ---Caller states that he is seeing blood in his underwear but no visible blood in urine. Denies dysuria, freq/urg. Does the patient have any new or worsening symptoms? ---Yes Will a triage be completed? ---Yes Related visit to physician within the last 2 weeks? ---No Does the PT have any chronic conditions? (i.e. diabetes, asthma, this includes High risk factors for pregnancy, etc.) ---No Is this a behavioral health or substance abuse call? ---No Guidelines Guideline Title Affirmed Question Affirmed Notes Nurse Date/Time (Eastern Time) Urine - Blood In Blood in urine (Exception: could be normal menstrual bleeding) Carmon, RN, Langley Gauss 12/15/2020 1:04:39 PM Disp. Time Eilene Ghazi Time) Disposition Final User 12/15/2020 12:30:29 PM Send To RN Personal Standifer, RN, Nira Conn 12/15/2020 1:07:23 PM See PCP within 24 Hours Yes Carmon, RN, Langley Gauss PLEASE NOTE: All timestamps contained within this report are represented as [place] Standard Time. CONFIDENTIALTY NOTICE: This fax transmission is intended only for the addressee. It contains information that is legally privileged, confidential or otherwise protected from use or disclosure. If you are not the intended recipient, you are strictly prohibited from reviewing, disclosing, copying using or disseminating any of this information or taking any action in reliance on or regarding this information. If you have received this fax in error, please notify us immediately by telephone so that we can arrange for its return to [place]. Phone: [phone], Toll-Free: [phone], Fax: [phone] Page: 2 of 2 Call Id: [id number] [place] Disagree/Comply Comply Caller Understands Yes PreDisposition Call Doctor Care Advice Given Per Guideline SEE PCP WITHIN 24 HOURS: SAMPLE: * Bring in a sample of the  bloody urine. * Keep it in the refrigerator until you leave. DRINK EXTRA FLUIDS: * Drink extra fluids. * Drink 8 to 10 cups (1,800 to 2,400 ml) of liquids a day. CALL BACK IF: * Fever or pain occurs * You become worse CARE ADVICE given per Urine, Blood In (Adult) guideline. Referrals REFERRED TO PCP OFFICE

## 2020-12-15 NOTE — Telephone Encounter (Signed)
Please schedule appointment with pt.  Thanks!

## 2020-12-15 NOTE — Telephone Encounter (Signed)
Agree with need for visit. Please schedule- can use SDA

## 2020-12-16 ENCOUNTER — Encounter: Payer: Self-pay | Admitting: Nurse Practitioner

## 2020-12-16 ENCOUNTER — Ambulatory Visit (INDEPENDENT_AMBULATORY_CARE_PROVIDER_SITE_OTHER): Payer: Medicare HMO | Admitting: Nurse Practitioner

## 2020-12-16 ENCOUNTER — Other Ambulatory Visit: Payer: Self-pay

## 2020-12-16 VITALS — BP 138/70 | HR 68 | Temp 98.6°F | Ht 67.0 in | Wt 192.0 lb

## 2020-12-16 DIAGNOSIS — R319 Hematuria, unspecified: Secondary | ICD-10-CM | POA: Diagnosis not present

## 2020-12-16 LAB — BASIC METABOLIC PANEL
BUN: 15 mg/dL (ref 6–23)
CO2: 27 mEq/L (ref 19–32)
Calcium: 9.4 mg/dL (ref 8.4–10.5)
Chloride: 103 mEq/L (ref 96–112)
Creatinine, Ser: 1.35 mg/dL (ref 0.40–1.50)
GFR: 52.46 mL/min — ABNORMAL LOW (ref >60.00)
Glucose, Bld: 91 mg/dL (ref 70–99)
Potassium: 4.3 mEq/L (ref 3.5–5.1)
Sodium: 139 mEq/L (ref 135–145)

## 2020-12-16 LAB — PSA: PSA: 1.5 ng/mL (ref 0.10–4.00)

## 2020-12-16 NOTE — Patient Instructions (Signed)
Go to lab for urine collection and blood draw  Continue to maintain adequate oral hydration

## 2020-12-16 NOTE — Progress Notes (Signed)
Subjective:  Patient ID: Jeffery [name], male    DOB: [birth year]  Age: 70 y.o. MRN: [id number]  CC: Acute Visit (Pt c/o blood in urine 2 days ago. Pt states he has not noticed any more since the first day. Denies pain, burning, or tingling sensations. )   Hematuria This is a new problem. The current episode started in the past 7 days. The problem has been resolved since onset. He describes the hematuria as gross hematuria. The hematuria occurs throughout his entire urinary stream. He reports no clotting in his urine stream. His pain is at a severity of 0/10. He is experiencing no pain. He describes his urine color as light pink. Irritative symptoms do not include frequency, nocturia or urgency. Obstructive symptoms do not include dribbling, incomplete emptying, an intermittent stream, a slower stream, straining or a weak stream. Pertinent negatives include no abdominal pain, bladder pain, bone pain, chills, dysuria, fever, flank pain, genital pain, urinary retention or weight loss. He is sexually active. There is no history of BPH, GU trauma, hypertension, kidney stones, prostatitis, recent infection, sickle cell disease, STDs or tobacco use.  Takes aleve once ow twice a week.  Wt Readings from Last 3 Encounters:  12/16/20 192 lb (87.1 kg)  08/08/19 193 lb (87.5 kg)  07/25/19 193 lb (87.5 kg)    Reviewed past Medical, Social and Family history today.  Outpatient Medications Prior to Visit  Medication Sig Dispense Refill   Multiple Vitamin (MULTIVITAMIN) tablet Take 1 tablet by mouth daily.       naproxen sodium (ALEVE) 220 MG tablet Take 220 mg by mouth.     tadalafil (CIALIS) 5 MG tablet Take 1 tablet (5 mg total) by mouth daily as needed for erectile dysfunction. 30 tablet 5   No facility-administered medications prior to visit.    ROS See HPI  Objective:  BP 138/70 (BP Location: Left Arm, Patient Position: Sitting, Cuff Size: Large)   Pulse 68   Temp 98.6 F (37 C) (Temporal)    Ht 5\' 7"  (1.702 m)   Wt 192 lb (87.1 kg)   SpO2 98%   BMI 30.07 kg/m   Physical Exam Vitals reviewed.  Pulmonary:     Effort: Pulmonary effort is normal.  Abdominal:     General: There is no distension.     Palpations: Abdomen is soft.     Tenderness: There is no abdominal tenderness. There is no right CVA tenderness, left CVA tenderness or guarding.  Neurological:     Mental Status: He is alert and oriented to person, place, and time.   Assessment & Plan:  This visit occurred during the SARS-CoV-2 public health emergency.  Safety protocols were in place, including screening questions prior to the visit, additional usage of staff PPE, and extensive cleaning of exam room while observing appropriate contact time as indicated for disinfecting solutions.   Jeffery [name] was seen today for acute visit.  Diagnoses and all orders for this visit:  Hematuria, unspecified type -     PSA -     Urinalysis w microscopic + reflex cultur -     Basic metabolic panel   Problem List Items Addressed This Visit   None Visit Diagnoses     Hematuria, unspecified type    -  Primary   Relevant Orders   PSA   Urinalysis w microscopic + reflex cultur   Basic metabolic panel       Follow-up: No follow-ups on file.  Wilfred Lacy, NP

## 2020-12-16 NOTE — Telephone Encounter (Signed)
Patient scheduled with Wilfred Lacy for 12/16/20 at 11:30am.

## 2020-12-17 ENCOUNTER — Ambulatory Visit: Payer: Medicare HMO | Admitting: Family Medicine

## 2020-12-17 LAB — URINALYSIS W MICROSCOPIC + REFLEX CULTURE
Bacteria, UA: NONE SEEN /HPF
Bilirubin Urine: NEGATIVE
Glucose, UA: NEGATIVE
Hgb urine dipstick: NEGATIVE
Hyaline Cast: NONE SEEN /LPF
Ketones, ur: NEGATIVE
Leukocyte Esterase: NEGATIVE
Nitrites, Initial: NEGATIVE
Protein, ur: NEGATIVE
RBC / HPF: NONE SEEN /HPF (ref 0–2)
Specific Gravity, Urine: 1.008 (ref 1.001–1.035)
Squamous Epithelial / HPF: NONE SEEN /HPF (ref <=5)
pH: 5.5 (ref 5.0–8.0)

## 2020-12-17 LAB — NO CULTURE INDICATED

## 2021-01-02 ENCOUNTER — Ambulatory Visit: Payer: Medicare HMO | Attending: Internal Medicine

## 2021-01-02 ENCOUNTER — Other Ambulatory Visit (HOSPITAL_BASED_OUTPATIENT_CLINIC_OR_DEPARTMENT_OTHER): Payer: Self-pay

## 2021-01-02 ENCOUNTER — Other Ambulatory Visit: Payer: Self-pay

## 2021-01-02 DIAGNOSIS — Z23 Encounter for immunization: Secondary | ICD-10-CM

## 2021-01-02 MED ORDER — PFIZER COVID-19 VAC BIVALENT 30 MCG/0.3ML IM SUSP
INTRAMUSCULAR | 0 refills | Status: DC
Start: 2021-01-02 — End: 2021-05-07
  Filled 2021-01-02: qty 0.3, 1d supply, fill #0

## 2021-01-02 NOTE — Progress Notes (Signed)
   Covid-19 Vaccination Clinic  Name:  HILLERY BHALLA    MRN: [id number] DOB: April 10, 1950  01/02/2021  Mr. Engelmann was observed post Covid-19 immunization for 15 minutes without incident. He was provided with Vaccine Information Sheet and instruction to access the V-Safe system.   Mr. Lumpkin was instructed to call 911 with any severe reactions post vaccine: Difficulty breathing  Swelling of face and throat  A fast heartbeat  A bad rash all over body  Dizziness and weakness

## 2021-02-26 DIAGNOSIS — H26491 Other secondary cataract, right eye: Secondary | ICD-10-CM | POA: Diagnosis not present

## 2021-02-26 DIAGNOSIS — H43811 Vitreous degeneration, right eye: Secondary | ICD-10-CM | POA: Diagnosis not present

## 2021-04-22 ENCOUNTER — Telehealth: Payer: Self-pay

## 2021-04-22 NOTE — Telephone Encounter (Signed)
Patient informed PSA results normal (1.1), repeat in 1 year. Patient verbalized understanding.

## 2021-05-06 NOTE — Progress Notes (Signed)
Phone: [phone]   Subjective:  Patient presents today for their annual physical. Chief complaint-noted.   See problem oriented charting- ROS- full  review of systems was completed and negative  except for: tinnitus stable, dental problems- just had tooth pulled, joint pain at times  The following were reviewed and entered/updated in epic: Past Medical History:  Diagnosis Date   Cataract    cataract extraction   Colon cancer ([place]) 08/23/2007   chemo   Patient Active Problem List   Diagnosis Date Noted   Hyperlipidemia 04/08/2015    Priority: Medium    History of colon cancer 11/05/2009    Priority: Medium    Hypertension 11/24/2006    Priority: Medium    ERECTILE DYSFUNCTION 11/24/2006   Past Surgical History:  Procedure Laterality Date   CATARACT EXTRACTION W/PHACO Right 09/27/2012   Procedure: CATARACT EXTRACTION PHACO AND INTRAOCULAR LENS PLACEMENT ([place]);  Surgeon: Adonis Brook, MD;  Location: [facility];  Service: Ophthalmology;  Laterality: Right;   CATARACT EXTRACTION W/PHACO Left 11/01/2012   Procedure: CATARACT EXTRACTION PHACO AND INTRAOCULAR LENS PLACEMENT ([facility]);  Surgeon: Adonis Brook, MD;  Location: [facility];  Service: Ophthalmology;  Laterality: Left;   COLECTOMY  2009   colon cancer   SIGMOIDOSCOPY  01/09/2015    Family History  Problem Relation Age of Onset   Hypertension Mother    Hypertension Father    Colon cancer Neg Hx    Esophageal cancer Neg Hx    Rectal cancer Neg Hx    Stomach cancer Neg Hx    Colon polyps Neg Hx     Medications- reviewed and updated Current Outpatient Medications  Medication Sig Dispense Refill   Multiple Vitamin (MULTIVITAMIN) tablet Take 1 tablet by mouth daily.       naproxen sodium (ALEVE) 220 MG tablet Take 220 mg by mouth.     tadalafil (CIALIS) 5 MG tablet Take 1 tablet (5 mg total) by mouth daily as needed for erectile dysfunction. 30 tablet 5   No current facility-administered medications for this visit.     Allergies-reviewed and updated No Known Allergies  Social History   Social History Narrative   Married. Wife bertha. 2 sons.   1 son has 1 grandson and a great granddaughter 4 in 2023. Marland Kitchen Other son has 1 grandson who is 2 in 2023.       Retired from Software engineer: bowling, golfing      Helps to [employer] great granddaughter    Objective  Objective:  BP 140/62    Pulse (!) 50    Temp 98.4 F (36.9 C)    Ht 5\' 7"  (1.702 m)    Wt 198 lb 12.8 oz (90.2 kg)    SpO2 97%    BMI 31.14 kg/m  Gen: NAD, resting comfortably HEENT: Mucous membranes are moist. Oropharynx normal. TM normal Neck: no thyromegaly CV:  bradycardic but regular no murmurs rubs or gallops Lungs: CTAB no crackles, wheeze, rhonchi Abdomen: soft/nontender/nondistended/normal bowel sounds. No rebound or guarding.  Ext: no edema Skin: warm, dry Neuro: grossly normal, moves all extremities, PERRLA Pain with ER of hip as well as Stinchfield testing   Assessment and Plan  71 y.o. male presenting for annual physical.  Health Maintenance counseling: 1. Anticipatory guidance: Patient counseled regarding regular dental exams -q6 months, eye exams -yearly   avoiding smoking and second hand smoke , limiting alcohol to 2 beverages per day - Does not drink. No illicit  drugs  2. Risk factor reduction:  Advised patient of need for regular exercise and diet rich and fruits and vegetables to reduce risk of heart attack and stroke.  Exercise-  walking down some trying to be there for his wife and she cant always walk- we discussed still ideally 150 minutes per week. Also stays busy watching kids. Still mows, plays some golf.  Diet/weight management-Eats lots of fresh foods. Brother works for [employer] so gets fresh vegetables .  Weight up 6 pounds from last physical-.  Wt Readings from Last 3 Encounters:  05/07/21 198 lb 12.8 oz (90.2 kg)  12/16/20 192 lb (87.1 kg)  08/08/19 193 lb (87.5 kg)  3.  Immunizations/screenings/ancillary studies DISCUSSED:  -Shingrix vaccination #1- recommended at pharmacy -Prevnar-20 vaccination #1 - recommended  in office today-patientopts out  -Declines Tdap unless cut/scrape  Immunization History  Administered Date(s) Administered   Moderna Covid-19 Vaccine Bivalent Booster 41yrs & up 01/02/2021   Moderna SARS-COV2 Booster Vaccination 02/14/2020   Moderna Sars-Covid-2 Vaccination 05/11/2019, 06/11/2019  4. Prostate cancer screening-PSA slightly up on September check-recheck PSA with labs today-if stable may discontinue checks  Lab Results  Component Value Date   PSA 1.50 12/16/2020   PSA 0.93 07/04/2019   PSA 0.80 07/11/2017   5. Colon cancer history - 01/09/15 with 3 year repeat planned - referred to GI 06/2019 visit-ordered referral again today. Discussed importance 6. Skin cancer screening- never followed by Dermatology- lower risk due to melanin content. advised regular sunscreen use. Denies worrisome, changing, or new skin lesions.  7. Smoking associated screening (lung cancer screening, AAA screen 65-75, UA)- Never smoker  8. STD screening - only active with wife  Status of chronic or acute concerns   #Essential hypertension S:-patient would prefer to remain off medicine as long as he was under JNC 8 goal-blood pressure under goal 150/90  Home readings #s: not checking lately BP Readings from Last 3 Encounters:  05/07/21 140/62  12/16/20 138/70  08/08/19 136/64  A/P: mild elevation and when I rechecked was over 150- may have white coat element - from avs "Your blood pressure trend concerns me. I would like for you to buy/use a home cuff to check at least 4x a week. Your goal is <135/85 at home.  Bring your home cuff and your log of blood pressures with you to next visit. Update me in 2-3 weeks by mychart on how your readings look"   #hyperlipidemiah S: Not on statin.  10-year ASCVD risk typically above 15% -Discussed CT cardiac scoring on  05/07/2021  Lab Results  Component Value Date   CHOL 229 (H) 07/04/2019   HDL 41.90 07/04/2019   LDLCALC 164 (H) 07/04/2019   LDLDIRECT 100.0 06/01/2016   TRIG 118.0 07/04/2019   CHOLHDL 5 07/04/2019  A/P: update lipids and then recalculate ascvd risk- he is open to ct cardiac scoring   #History of colon cancer-last colonoscopy 2016 and referred to me in 2021 in 2023 to GI- see above    #ED- cialis 5 mg daily is helpful- declines refill    #Allergies-no Rx.  Preferred to tolerate  #Right hip pain- so about 2-3 months of symptoms. No fall or injury. Feels pain either in hip laterally or in groin   -on exam has pain with ER of the hip- likely hip arthritis- positive Stinchfield test as well.  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  Had resolved by time of visit no blood was noted on urinalysis.  PSA had increased slightly but not dramatically compared to 4 years ago-appears was essentially told to follow-up with PCP if recurrent issues. Has not had more issues  -I discussed with patient that more aggressive with gross hematuria and recommended urology referral for comprehensive evaluation-PSA was also up slightly so recommended repeating that today -discussed risks of  cancer like bladder or kidney- he declines follow up with urology for now- he will let me know if changes mind or if any more blood in the urine  Recommended follow up: Return in about 6 months (around 11/04/2021) for follow up- or sooner if needed.  Lab/Order associations:NOT fasting   ICD-10-CM   1. Preventative health care  Z00.00     2. Essential hypertension  I10 CBC with Differential/Platelet     Comprehensive metabolic panel    Lipid panel    3. Hyperlipidemia, unspecified hyperlipidemia type  E78.5 CBC with Differential/Platelet    Comprehensive metabolic panel    Lipid panel    4. History of colon cancer  Z85.038 Ambulatory referral to Gastroenterology    5. Gross hematuria  R31.0     6. Nocturia  R35.1 PSA      No orders of the defined types were placed in this encounter.  I,Jada Bradford,acting as a scribe for Garret Reddish, MD.,have documented all relevant documentation on the behalf of Garret Reddish, MD,as directed by  Garret Reddish, MD while in the presence of Garret Reddish, MD.  I, Garret Reddish, MD, have reviewed all documentation for this visit. The documentation on 05/07/21 for the exam, diagnosis, procedures, and orders are all accurate and complete.  Return precautions advised.  Garret Reddish, MD

## 2021-05-07 ENCOUNTER — Ambulatory Visit (INDEPENDENT_AMBULATORY_CARE_PROVIDER_SITE_OTHER): Payer: Medicare (Managed Care) | Admitting: Family Medicine

## 2021-05-07 ENCOUNTER — Encounter: Payer: Self-pay | Admitting: Family Medicine

## 2021-05-07 ENCOUNTER — Other Ambulatory Visit: Payer: Self-pay

## 2021-05-07 VITALS — BP 140/62 | HR 50 | Temp 98.4°F | Ht 67.0 in | Wt 198.8 lb

## 2021-05-07 DIAGNOSIS — R310 Gross hematuria: Secondary | ICD-10-CM | POA: Diagnosis not present

## 2021-05-07 DIAGNOSIS — Z85038 Personal history of other malignant neoplasm of large intestine: Secondary | ICD-10-CM

## 2021-05-07 DIAGNOSIS — R351 Nocturia: Secondary | ICD-10-CM | POA: Diagnosis not present

## 2021-05-07 DIAGNOSIS — Z0000 Encounter for general adult medical examination without abnormal findings: Secondary | ICD-10-CM

## 2021-05-07 DIAGNOSIS — I10 Essential (primary) hypertension: Secondary | ICD-10-CM

## 2021-05-07 DIAGNOSIS — E785 Hyperlipidemia, unspecified: Secondary | ICD-10-CM | POA: Diagnosis not present

## 2021-05-07 LAB — LIPID PANEL
Cholesterol: 204 mg/dL — ABNORMAL HIGH (ref 0–200)
HDL: 43.3 mg/dL (ref >39.00)
LDL Cholesterol: 131 mg/dL — ABNORMAL HIGH (ref 0–99)
NonHDL: 160.74
Total CHOL/HDL Ratio: 5
Triglycerides: 147 mg/dL (ref 0.0–149.0)
VLDL: 29.4 mg/dL (ref 0.0–40.0)

## 2021-05-07 LAB — COMPREHENSIVE METABOLIC PANEL
ALT: 11 U/L (ref 0–53)
AST: 20 U/L (ref 0–37)
Albumin: 4.3 g/dL (ref 3.5–5.2)
Alkaline Phosphatase: 48 U/L (ref 39–117)
BUN: 12 mg/dL (ref 6–23)
CO2: 27 mEq/L (ref 19–32)
Calcium: 9.4 mg/dL (ref 8.4–10.5)
Chloride: 103 mEq/L (ref 96–112)
Creatinine, Ser: 1.26 mg/dL (ref 0.40–1.50)
GFR: 56.83 mL/min — ABNORMAL LOW (ref >60.00)
Glucose, Bld: 91 mg/dL (ref 70–99)
Potassium: 4 mEq/L (ref 3.5–5.1)
Sodium: 140 mEq/L (ref 135–145)
Total Bilirubin: 0.7 mg/dL (ref 0.2–1.2)
Total Protein: 7.1 g/dL (ref 6.0–8.3)

## 2021-05-07 LAB — CBC WITH DIFFERENTIAL/PLATELET
Basophils Absolute: 0 10*3/uL (ref 0.0–0.1)
Basophils Relative: 0.8 % (ref 0.0–3.0)
Eosinophils Absolute: 0.3 10*3/uL (ref 0.0–0.7)
Eosinophils Relative: 5 % (ref 0.0–5.0)
HCT: 40 % (ref 39.0–52.0)
Hemoglobin: 12.8 g/dL — ABNORMAL LOW (ref 13.0–17.0)
Lymphocytes Relative: 26.8 % (ref 12.0–46.0)
Lymphs Abs: 1.4 10*3/uL (ref 0.7–4.0)
MCHC: 31.9 g/dL (ref 30.0–36.0)
MCV: 82.3 fl (ref 78.0–100.0)
Monocytes Absolute: 0.5 10*3/uL (ref 0.1–1.0)
Monocytes Relative: 10.2 % (ref 3.0–12.0)
Neutro Abs: 3 10*3/uL (ref 1.4–7.7)
Neutrophils Relative %: 57.2 % (ref 43.0–77.0)
Platelets: 181 10*3/uL (ref 150.0–400.0)
RBC: 4.87 Mil/uL (ref 4.22–5.81)
RDW: 14.3 % (ref 11.5–15.5)
WBC: 5.2 10*3/uL (ref 4.0–10.5)

## 2021-05-07 LAB — PSA: PSA: 0.86 ng/mL (ref 0.10–4.00)

## 2021-05-07 NOTE — Patient Instructions (Addendum)
Please check with your pharmacy to see if they have the shingrix vaccine. If they do- please get this immunization and update [place] by phone call or mychart with dates you receive the vaccine -declined prevnar 20 pneumonia shot today  Also recommended Tdap at [facility] contact Please call to schedule colonoscopy ASAP with history of colon cancer Address: [address], [place], [place] [address] Phone: [phone]   Please stop by lab before you go If you have mychart- we will send your results within 3 business days of [place] receiving them.  If you do not have mychart- we will call you about results within 5 business days of [place] receiving them.  *please also note that you will see labs on mychart as soon as they post. I will later go in and write notes on them- will say "notes from Dr. Yong Channel"   Your blood pressure trend concerns me. I would like for you to buy/use a home cuff to check at least 4x a week. Your goal is <135/85 at home.  Bring your home cuff and your log of blood pressures with you to next visit. Update me in 2-3 weeks by mychart on how your readings look -if you do not have a cough omron series 3 is reasonable   Recommended follow up: Return in about 6 months (around 11/04/2021) for follow up- or sooner if needed.

## 2021-08-14 ENCOUNTER — Telehealth: Payer: Self-pay

## 2021-08-14 NOTE — Telephone Encounter (Signed)
Dr. Fuller Plan, Please review recall.  Patient was referred by Dr. Yong Channel for a Hx of colon cancer.  He  had a colonoscopy in 07/2019.  Recall in for 5/26.  Thank you

## 2021-08-16 NOTE — Telephone Encounter (Signed)
This patient underwent a subtotal colectomy in 2009 for colon cancer. He was polyp free on his last 2 flex sigmoidoscopies. He is appropriately scheduled for a surveillance flex sigmoidoscopy on a 5 year interval which is due in May 2026. If he has new LGI symptoms or other GI concerns we should evaluate him in the office.

## 2021-08-17 ENCOUNTER — Telehealth: Payer: Self-pay | Admitting: Family Medicine

## 2021-08-17 NOTE — Telephone Encounter (Signed)
Copied from [place]. Topic: Medicare AWV >> Aug 17, 2021  2:19 PM Harris-Coley, Hannah Beat wrote: Reason for CRM: Left message for patient to schedule Annual Wellness Visit.  Please schedule with Nurse Health Advisor Charlott Rakes, RN at [facility].  Please call [phone] ask for [facility]

## 2021-08-17 NOTE — Telephone Encounter (Signed)
Notes added to the referral.  Flex sig recall is in and correct.

## 2021-11-05 ENCOUNTER — Ambulatory Visit: Payer: Medicare (Managed Care) | Admitting: Family Medicine

## 2021-11-13 ENCOUNTER — Ambulatory Visit: Payer: Medicare (Managed Care) | Admitting: Family Medicine

## 2021-11-13 ENCOUNTER — Encounter: Payer: Self-pay | Admitting: Family Medicine

## 2021-11-13 VITALS — BP 152/70 | HR 66 | Temp 98.2°F | Ht 67.0 in | Wt 186.8 lb

## 2021-11-13 DIAGNOSIS — I10 Essential (primary) hypertension: Secondary | ICD-10-CM | POA: Diagnosis not present

## 2021-11-13 DIAGNOSIS — E785 Hyperlipidemia, unspecified: Secondary | ICD-10-CM | POA: Diagnosis not present

## 2021-11-13 LAB — CBC WITH DIFFERENTIAL/PLATELET
Basophils Absolute: 0 10*3/uL (ref 0.0–0.1)
Basophils Relative: 0.7 % (ref 0.0–3.0)
Eosinophils Absolute: 0.3 10*3/uL (ref 0.0–0.7)
Eosinophils Relative: 5.4 % — ABNORMAL HIGH (ref 0.0–5.0)
HCT: 40.5 % (ref 39.0–52.0)
Hemoglobin: 13.2 g/dL (ref 13.0–17.0)
Lymphocytes Relative: 31.5 % (ref 12.0–46.0)
Lymphs Abs: 1.6 10*3/uL (ref 0.7–4.0)
MCHC: 32.6 g/dL (ref 30.0–36.0)
MCV: 83.1 fl (ref 78.0–100.0)
Monocytes Absolute: 0.5 10*3/uL (ref 0.1–1.0)
Monocytes Relative: 10.8 % (ref 3.0–12.0)
Neutro Abs: 2.6 10*3/uL (ref 1.4–7.7)
Neutrophils Relative %: 51.6 % (ref 43.0–77.0)
Platelets: 184 10*3/uL (ref 150.0–400.0)
RBC: 4.88 Mil/uL (ref 4.22–5.81)
RDW: 14.9 % (ref 11.5–15.5)
WBC: 5 10*3/uL (ref 4.0–10.5)

## 2021-11-13 LAB — COMPREHENSIVE METABOLIC PANEL
ALT: 12 U/L (ref 0–53)
AST: 23 U/L (ref 0–37)
Albumin: 4.6 g/dL (ref 3.5–5.2)
Alkaline Phosphatase: 46 U/L (ref 39–117)
BUN: 18 mg/dL (ref 6–23)
CO2: 25 mEq/L (ref 19–32)
Calcium: 8.9 mg/dL (ref 8.4–10.5)
Chloride: 106 mEq/L (ref 96–112)
Creatinine, Ser: 1.32 mg/dL (ref 0.40–1.50)
GFR: 53.55 mL/min — ABNORMAL LOW (ref >60.00)
Glucose, Bld: 110 mg/dL — ABNORMAL HIGH (ref 70–99)
Potassium: 4.3 mEq/L (ref 3.5–5.1)
Sodium: 143 mEq/L (ref 135–145)
Total Bilirubin: 0.5 mg/dL (ref 0.2–1.2)
Total Protein: 7.3 g/dL (ref 6.0–8.3)

## 2021-11-13 MED ORDER — AMLODIPINE BESYLATE 2.5 MG PO TABS: 2.5000 mg | ORAL_TABLET | Freq: Every day | ORAL | 3 refills | Status: DC

## 2021-11-13 NOTE — Patient Instructions (Addendum)
Blood pressure mildly elevated previously into the 140s but now up into the 150s both here and at home-we opted to try a very low-dose of medication amlodipine 2.5 mg and have him follow-up with [place] in 1 months to recheck and make sure blood pressure has improved-May need additional support but we will start with lower dose  Try tylenol arthritis instead of aleve and see if it at least takes the edge off  Strongly recommend Tdap at pharmacy even if you dont get any other vaccines  Please stop by lab before you go If you have mychart- we will send your results within 3 business days of [place] receiving them.  If you do not have mychart- we will call you about results within 5 business days of [place] receiving them.  *please also note that you will see labs on mychart as soon as they post. I will later go in and write notes on them- will say "notes from Dr. Yong Channel"   Recommended follow up: Return in about 1 month (around 12/14/2021) for followup or sooner if needed.Schedule b4 you leave.

## 2021-11-13 NOTE — Progress Notes (Signed)
Phone [phone] In person visit   Subjective:   Jeffery [name] is a 71 y.o. year old very pleasant male patient who presents for/with See problem oriented charting Chief Complaint  Patient presents with   Follow-up   Past Medical History-  Patient Active Problem List   Diagnosis Date Noted   Hyperlipidemia 04/08/2015    Priority: Medium    History of colon cancer 11/05/2009    Priority: Medium    Hypertension 11/24/2006    Priority: Medium    ERECTILE DYSFUNCTION 11/24/2006   Medications- reviewed and updated Current Outpatient Medications  Medication Sig Dispense Refill   amLODipine (NORVASC) 2.5 MG tablet Take 1 tablet (2.5 mg total) by mouth daily. 90 tablet 3   Multiple Vitamin (MULTIVITAMIN) tablet Take 1 tablet by mouth daily.       naproxen sodium (ALEVE) 220 MG tablet Take 220 mg by mouth.     tadalafil (CIALIS) 5 MG tablet Take 1 tablet (5 mg total) by mouth daily as needed for erectile dysfunction. 30 tablet 5   No current facility-administered medications for this visit.     Objective:  BP (!) 152/70   Pulse 66   Temp 98.2 F (36.8 C)   Ht '5\' 7"'$  (1.702 m)   Wt 186 lb 12.8 oz (84.7 kg)   SpO2 98%   BMI 29.26 kg/m  Gen: NAD, resting comfortably CV: RRR no murmurs rubs or gallops Lungs: CTAB no crackles, wheeze, rhonchi Abdomen: soft/nontender/nondistended/normal bowel sounds.  Ext: no edema Skin: warm, dry    Assessment and Plan   #Essential hypertension S:medication: none patient would prefer to remain off medicine as long as he was under JNC 8 goal-blood pressure under goal 150/90  Home readings #s: rarely checks -maybe once every 6 months- can be as high as 150 BP Readings from Last 3 Encounters:  11/13/21 (!) 152/70  05/07/21 140/62  12/16/20 138/70  A/P: Blood pressure mildly elevated previously into the 140s but now up into the 150s both here and at home-we opted to try a very low-dose of medication amlodipine 2.5 mg and have him  follow-up with [place] in 1 months to recheck and make sure blood pressure has improved-May need additional support but we will start with lower dose  #GFR under 60 x 2- discussed good hydration and avoiding nsaids ideally- tylenol arthritis if needed. Monitor CMP. Sparing aleve now.    #hyperlipidemia S: Not on statin.  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 Worked actively at [employer] yesterday all day -feels eats reasonably healthy- could cut down on nighttime snacking Lab Results  Component Value Date   CHOL 204 (H) 05/07/2021   HDL 43.30 05/07/2021   LDLCALC 131 (H) 05/07/2021   LDLDIRECT 100.0 06/01/2016   TRIG 147.0 05/07/2021   CHOLHDL 5 05/07/2021  A/P: poor control and elevated ascvd risk-Discussed CT cardiac scoring on 05/07/2021 and he declined- discussed again today- prefers to work on healthy eating/regular exercise and recheck at physical -dash eating plan also given   #History of colon cancer-last colonoscopy 2016 and per DR. Fuller Plan "This patient underwent a subtotal colectomy in 2009 for colon cancer. He was polyp free on his last 2 flex sigmoidoscopies. He is appropriately scheduled for a surveillance flex sigmoidoscopy on a 5 year interval which is due in May 2026. If he has new LGI symptoms or other GI concerns we should evaluate him in the office. "  -  mild anemia on last check- will check cbc to make sure not trending down - I had not noted the flex sig when I placed GI referral last visit. If worsening would still refer back to GI  #ED S: cialis 5 mg daily is helpful if needed  A/P:  Controlled. Continue current medications. Doesn't need refill right now   #Hematuria-gross hematuria was noted prior to December 16, 2020 visit with another [place] office.  Had resolved by time of visit no blood was noted on urinalysis.  PSA had increased slightly but not dramatically compared to 4 years ago (then later  returned to normal)-appears was essentially told to follow-up with PCP if recurrent issues. No more blood in urine.   I recommended urology visit and discussed cancer risk- he declines despite cancer risk  #Right hip pain- ongoing and also notes some tenderness and puffiness in groin that comes and goes.  Not active today- wants to monitor  Recommended follow up: Return in about 1 month (around 12/14/2021) for followup or sooner if needed.Schedule b4 you leave. Future Appointments  Date Time Provider [place]  05/10/2022  1:00 PM Marin Olp, MD [facility]   Lab/Order associations:   ICD-10-CM   1. Primary hypertension  I10 CBC with Differential/Platelet    Comprehensive metabolic panel    2. Hyperlipidemia, unspecified hyperlipidemia type  E78.5       Meds ordered this encounter  Medications   amLODipine (NORVASC) 2.5 MG tablet    Sig: Take 1 tablet (2.5 mg total) by mouth daily.    Dispense:  90 tablet    Refill:  3    Return precautions advised.  Garret Reddish, MD

## 2021-12-15 ENCOUNTER — Ambulatory Visit: Payer: Medicare (Managed Care) | Admitting: Family Medicine

## 2021-12-22 ENCOUNTER — Ambulatory Visit: Payer: Medicare (Managed Care) | Admitting: Family Medicine

## 2021-12-22 ENCOUNTER — Encounter: Payer: Self-pay | Admitting: Family Medicine

## 2021-12-22 VITALS — BP 138/68 | HR 57 | Temp 98.6°F | Ht 67.0 in | Wt 183.8 lb

## 2021-12-22 DIAGNOSIS — I10 Essential (primary) hypertension: Secondary | ICD-10-CM | POA: Diagnosis not present

## 2021-12-22 NOTE — Progress Notes (Signed)
  Phone [phone] In person visit   Subjective:   Jeffery [name] is a 71 y.o. year old very pleasant male patient who presents for/with See problem oriented charting Chief Complaint  Patient presents with   Follow-up   Hypertension    Past Medical History-  Patient Active Problem List   Diagnosis Date Noted   Hyperlipidemia 04/08/2015    Priority: Medium    History of colon cancer 11/05/2009    Priority: Medium    Hypertension 11/24/2006    Priority: Medium    ERECTILE DYSFUNCTION 11/24/2006    Medications- reviewed and updated Current Outpatient Medications  Medication Sig Dispense Refill   amLODipine (NORVASC) 2.5 MG tablet Take 1 tablet (2.5 mg total) by mouth daily. 90 tablet 3   Multiple Vitamin (MULTIVITAMIN) tablet Take 1 tablet by mouth daily.       naproxen sodium (ALEVE) 220 MG tablet Take 220 mg by mouth.     tadalafil (CIALIS) 5 MG tablet Take 1 tablet (5 mg total) by mouth daily as needed for erectile dysfunction. 30 tablet 5   No current facility-administered medications for this visit.     Objective:  BP 138/68   Pulse (!) 57   Temp 98.6 F (37 C)   Ht '5\' 7"'$  (1.702 m)   Wt 183 lb 12.8 oz (83.4 kg)   SpO2 98%   BMI 28.79 kg/m  Gen: NAD, resting comfortably CV: RRR no murmurs rubs or gallops Lungs: CTAB no crackles, wheeze, rhonchi Ext: no edema Skin: warm, dry    Assessment and Plan   #Essential hypertension S: medication: amlodipine 2.5 mg morning- took this am Home readings #s: not checking lately BP Readings from Last 3 Encounters:  12/22/21 138/68  11/13/21 (!) 152/70  05/07/21 140/62  A/P: much improved control- he prefers to minimize medicine and does not want to push for goal lower than 130/80 - recheck in February and encouraged him to do some home checks as well prior to next visit so we can compare readings  Recommended follow up: Return for next already scheduled visit or sooner if needed. Future Appointments  Date Time  Provider [place]  05/10/2022  1:00 PM Marin Olp, MD [facility]    Lab/Order associations:   ICD-10-CM   1. Primary hypertension  I10      Return precautions advised.  Garret Reddish, MD

## 2021-12-22 NOTE — Patient Instructions (Addendum)
much improved control- he prefers to minimize medicine and does not want to push for goal lower than 130/80 - recheck in February and encouraged him to do some home checks as well prior to next visit so we can compare readings  Recommended follow up: Return for next already scheduled visit or sooner if needed.

## 2022-02-01 ENCOUNTER — Telehealth: Payer: Self-pay | Admitting: Family Medicine

## 2022-02-01 NOTE — Telephone Encounter (Signed)
Copied from [place] [phone]. Topic: Medicare AWV >> Feb 01, 2022 12:29 PM Gillis Santa wrote: Reason for CRM: LVM FOR PATIENT TO CALL BACK [phone] TO SCHEDULE AWV WITH HEALTH COACH

## 2022-05-10 ENCOUNTER — Encounter: Payer: Self-pay | Admitting: Family Medicine

## 2022-05-10 ENCOUNTER — Ambulatory Visit (INDEPENDENT_AMBULATORY_CARE_PROVIDER_SITE_OTHER): Payer: Medicare Other | Admitting: Family Medicine

## 2022-05-10 VITALS — BP 164/78 | HR 60 | Temp 98.9°F | Ht 67.0 in | Wt 190.8 lb

## 2022-05-10 DIAGNOSIS — I10 Essential (primary) hypertension: Secondary | ICD-10-CM

## 2022-05-10 DIAGNOSIS — E785 Hyperlipidemia, unspecified: Secondary | ICD-10-CM | POA: Diagnosis not present

## 2022-05-10 DIAGNOSIS — Z0000 Encounter for general adult medical examination without abnormal findings: Secondary | ICD-10-CM | POA: Diagnosis not present

## 2022-05-10 LAB — LIPID PANEL
Cholesterol: 227 mg/dL — ABNORMAL HIGH (ref 0–200)
HDL: 44.1 mg/dL (ref >39.00)
LDL Cholesterol: 153 mg/dL — ABNORMAL HIGH (ref 0–99)
NonHDL: 182.79
Total CHOL/HDL Ratio: 5
Triglycerides: 150 mg/dL — ABNORMAL HIGH (ref 0.0–149.0)
VLDL: 30 mg/dL (ref 0.0–40.0)

## 2022-05-10 LAB — CBC WITH DIFFERENTIAL/PLATELET
Basophils Absolute: 0 10*3/uL (ref 0.0–0.1)
Basophils Relative: 0.6 % (ref 0.0–3.0)
Eosinophils Absolute: 0.2 10*3/uL (ref 0.0–0.7)
Eosinophils Relative: 4.5 % (ref 0.0–5.0)
HCT: 40.3 % (ref 39.0–52.0)
Hemoglobin: 13 g/dL (ref 13.0–17.0)
Lymphocytes Relative: 26.3 % (ref 12.0–46.0)
Lymphs Abs: 1.4 10*3/uL (ref 0.7–4.0)
MCHC: 32.2 g/dL (ref 30.0–36.0)
MCV: 83.1 fl (ref 78.0–100.0)
Monocytes Absolute: 0.5 10*3/uL (ref 0.1–1.0)
Monocytes Relative: 9 % (ref 3.0–12.0)
Neutro Abs: 3.2 10*3/uL (ref 1.4–7.7)
Neutrophils Relative %: 59.6 % (ref 43.0–77.0)
Platelets: 176 10*3/uL (ref 150.0–400.0)
RBC: 4.85 Mil/uL (ref 4.22–5.81)
RDW: 14.8 % (ref 11.5–15.5)
WBC: 5.3 10*3/uL (ref 4.0–10.5)

## 2022-05-10 LAB — COMPREHENSIVE METABOLIC PANEL
ALT: 9 U/L (ref 0–53)
AST: 17 U/L (ref 0–37)
Albumin: 4.5 g/dL (ref 3.5–5.2)
Alkaline Phosphatase: 46 U/L (ref 39–117)
BUN: 14 mg/dL (ref 6–23)
CO2: 26 mEq/L (ref 19–32)
Calcium: 9.3 mg/dL (ref 8.4–10.5)
Chloride: 105 mEq/L (ref 96–112)
Creatinine, Ser: 1.37 mg/dL (ref 0.40–1.50)
GFR: 51.03 mL/min — ABNORMAL LOW (ref >60.00)
Glucose, Bld: 78 mg/dL (ref 70–99)
Potassium: 4.3 mEq/L (ref 3.5–5.1)
Sodium: 140 mEq/L (ref 135–145)
Total Bilirubin: 0.5 mg/dL (ref 0.2–1.2)
Total Protein: 7 g/dL (ref 6.0–8.3)

## 2022-05-10 MED ORDER — AMLODIPINE BESYLATE 5 MG PO TABS: 5.0000 mg | ORAL_TABLET | Freq: Every day | ORAL | 3 refills | Status: DC

## 2022-05-10 NOTE — Progress Notes (Signed)
Phone: [phone]   Subjective:  Patient presents today for their annual physical. Chief complaint-noted.   See problem oriented charting- ROS- full  review of systems was completed and negative  except for: some hip pain  The following were reviewed and entered/updated in epic: Past Medical History:  Diagnosis Date   Cataract    cataract extraction   Colon cancer ([place]) 08/23/2007   chemo   Patient Active Problem List   Diagnosis Date Noted   Hyperlipidemia 04/08/2015    Priority: Medium    History of colon cancer 11/05/2009    Priority: Medium    Hypertension 11/24/2006    Priority: Medium    ERECTILE DYSFUNCTION 11/24/2006   Past Surgical History:  Procedure Laterality Date   CATARACT EXTRACTION W/PHACO Right 09/27/2012   Procedure: CATARACT EXTRACTION PHACO AND INTRAOCULAR LENS PLACEMENT ([place]);  Surgeon: Adonis Brook, MD;  Location: [facility];  Service: Ophthalmology;  Laterality: Right;   CATARACT EXTRACTION W/PHACO Left 11/01/2012   Procedure: CATARACT EXTRACTION PHACO AND INTRAOCULAR LENS PLACEMENT ([facility]);  Surgeon: Adonis Brook, MD;  Location: [facility];  Service: Ophthalmology;  Laterality: Left;   COLECTOMY  2009   colon cancer   SIGMOIDOSCOPY  01/09/2015    Family History  Problem Relation Age of Onset   Hypertension Mother    Hypertension Father    Colon cancer Neg Hx    Esophageal cancer Neg Hx    Rectal cancer Neg Hx    Stomach cancer Neg Hx    Colon polyps Neg Hx     Medications- reviewed and updated Current Outpatient Medications  Medication Sig Dispense Refill   Multiple Vitamin (MULTIVITAMIN) tablet Take 1 tablet by mouth daily.       naproxen sodium (ALEVE) 220 MG tablet Take 220 mg by mouth.     tadalafil (CIALIS) 5 MG tablet Take 1 tablet (5 mg total) by mouth daily as needed for erectile dysfunction. 30 tablet 5   amLODipine (NORVASC) 5 MG tablet Take 1 tablet (5 mg total) by mouth daily. 90 tablet 3   No current facility-administered medications  for this visit.    Allergies-reviewed and updated No Known Allergies  Social History   Social History Narrative   Married. Wife bertha. 2 sons.   1 son has 1 grandson and a great granddaughter 4 in 2023. Marland Kitchen Other son has 1 grandson who is 2 in 2023.       Retired from Software engineer: bowling, golfing      Helps to [employer] great granddaughter    Objective  Objective:  BP (!) 164/78   Pulse 60   Temp 98.9 F (37.2 C)   Ht 5' 7"$  (1.702 m)   Wt 190 lb 12.8 oz (86.5 kg)   SpO2 96%   BMI 29.88 kg/m  Gen: NAD, resting comfortably HEENT: Mucous membranes are moist. Oropharynx normal Neck: no thyromegaly CV: RRR no murmurs rubs or gallops Lungs: CTAB no crackles, wheeze, rhonchi Abdomen: soft/nontender/nondistended/normal bowel sounds. No rebound or guarding.  Ext: no edema Skin: warm, dry Neuro: grossly normal, moves all extremities, PERRLA    Assessment and Plan  72 y.o. male presenting for annual physical.  Health Maintenance counseling: 1. Anticipatory guidance: Patient counseled regarding regular dental exams -q6 months, eye exams - yearly,  avoiding smoking and second hand smoke , limiting alcohol to 2 beverages per day - doesn't drink, no illicit drugs .   2. Risk factor reduction:  Advised patient of  need for regular exercise and diet rich and fruits and vegetables to reduce risk of heart attack and stroke.  Exercise- Also still walks some, mows, placing golf-recommended at least 150 minutes a week on average.  Diet/weight management-Down 8 pounds in the last year but up some from alast visit-still eating a lot of fresh foods as brother works for [employer]. Does feel he could cut salt.  Wt Readings from Last 3 Encounters:  05/10/22 190 lb 12.8 oz (86.5 kg)  12/22/21 183 lb 12.8 oz (83.4 kg)  11/13/21 186 lb 12.8 oz (84.7 kg)  3. Immunizations/screenings/ancillary studies-discussed Shingrix at pharmacy, discussed Prevnar 20 today-she declines, recommended  Tdap if gets cut or scrape-he declines it prophylactically at pharmacy, declines RSV, declines flu and covid shot.  -reports also had covid shot this year Immunization History  Administered Date(s) Administered   Moderna Covid-19 Vaccine Bivalent Booster 45yr & up 01/02/2021   Moderna SARS-COV2 Booster Vaccination 02/14/2020   Moderna Sars-Covid-2 Vaccination 05/11/2019, 06/11/2019  4. Prostate cancer screening- low risk prior PSA trend-we have opted to discontinue screenings- he agrees Lab Results  Component Value Date   PSA 0.86 05/07/2021   PSA 1.50 12/16/2020   PSA 0.93 07/04/2019   5. Colon cancer screening - per Dr. SAlphonsus Sias5/21/23- This patient underwent a subtotal colectomy in 2009 for colon cancer. He was polyp free on his last 2 flex sigmoidoscopies. He is appropriately scheduled for a surveillance flex sigmoidoscopy on a 5 year interval which is due in May 2026.    6. Skin cancer screening- lower risk due to melanin content advised regular sunscreen use. Denies worrisome, changing, or new skin lesions.  7. Smoking associated screening (lung cancer screening, AAA screen 65-75, UA)- never smoker 8. STD screening - only active with wife  Status of chronic or acute concerns   #Essential hypertension S: Medication: Amlodipine 2.5 mg daily -feels higher this time of year- eating a lil worse and not as active Home readings #s: can be up to 140 or 150s at home. But sparingly checks -no chest pain or shortness of breath or blurry vision. No edema BP Readings from Last 3 Encounters:  05/10/22 (!) 164/78  12/22/21 138/68  11/13/21 (!) 152/70   A/P: Blood pressure is above goal of at least less than 140/90 at 166/74 today-we opted to increase amlodipine to 5 mg and recommended 1 month follow-up-he may need more blood pressure support but we will start here   #hyperlipidemia S: Not on statin.  10-year ASCVD risk typically above 15% -Discussed CT cardiac scoring on 05/07/2021- he  declined.   A/P: he is open to trying medicine if #s not improved- update lipid panel today. Prefers to hold off on ct calcium scoring - would want to start perhaps weekly.    #ED- cialis 5 mg daily is helpful    #Hematuria-gross hematuria was noted prior to [place], [place].  Had resolved by time of visit no blood was noted on urinalysis.  PSA had increased slightly but not dramatically compared to 4 years ago (but came down on repeat)-appears was essentially told to follow-up with PCP if recurrent issues. I recommended urology visit and discussed cancer risk- he declines  -declines today unless microscopic hematuria  Recommended follow up: Return in about 1 month (around 06/08/2022) for followup or sooner if needed.Schedule b4 you leave.  Lab/Order associations:NOT fasting   ICD-10-CM   1. Preventative health care  Z00.00     2.  Hyperlipidemia, unspecified hyperlipidemia type  E78.5 CBC with Differential/Platelet    Comprehensive metabolic panel    Lipid panel    Urinalysis, Routine w reflex microscopic    3. Primary hypertension  I10 CBC with Differential/Platelet    Comprehensive metabolic panel    Urinalysis, Routine w reflex microscopic      Meds ordered this encounter  Medications   amLODipine (NORVASC) 5 MG tablet    Sig: Take 1 tablet (5 mg total) by mouth daily.    Dispense:  90 tablet    Refill:  3    Return precautions advised.  Garret Reddish, MD

## 2022-05-10 NOTE — Patient Instructions (Addendum)
You are eligible to schedule your annual wellness visit with our nurse specialist Otila Kluver.  Please consider scheduling this before you leave today  Due for Tetanus shot if get cut/scrape/bad sting  Team check [facility] he states he had updated covid shot  Blood pressure is above goal of at least less than 140/90 at 166/74 today-we opted to increase amlodipine to 5 mg and recommended 1 month follow-up-he may need more blood pressure support but we will start here-bring a copy of home readings and we can average those out- that helps [place] decide whether to adjust or not  Please stop by lab before you go If you have mychart- we will send your results within 3 business days of [place] receiving them.  If you do not have mychart- we will call you about results within 5 business days of [place] receiving them.  *please also note that you will see labs on mychart as soon as they post. I will later go in and write notes on them- will say "notes from Dr. Yong Channel"   Recommended follow up: Return in about 1 month (around 06/08/2022) for followup or sooner if needed.Schedule b4 you leave.

## 2022-05-11 ENCOUNTER — Telehealth: Payer: Self-pay | Admitting: Family Medicine

## 2022-05-11 LAB — URINALYSIS, ROUTINE W REFLEX MICROSCOPIC
Bilirubin Urine: NEGATIVE
Hgb urine dipstick: NEGATIVE
Ketones, ur: NEGATIVE
Leukocytes,Ua: NEGATIVE
Nitrite: NEGATIVE
RBC / HPF: NONE SEEN (ref 0–?)
Specific Gravity, Urine: 1.02 (ref 1.000–1.030)
Total Protein, Urine: NEGATIVE
Urine Glucose: NEGATIVE
Urobilinogen, UA: 0.2 (ref 0.0–1.0)
WBC, UA: NONE SEEN (ref 0–?)
pH: 6 (ref 5.0–8.0)

## 2022-05-11 NOTE — Telephone Encounter (Signed)
Copied from [place] [id number]. Topic: Medicare AWV >> May 11, 2022 12:58 PM Gillis Santa wrote: Reason for CRM: LVM PATIENT TO CALL KAREN [phone]--SCHEDULE [place]

## 2022-06-01 ENCOUNTER — Other Ambulatory Visit: Payer: Medicare Other | Admitting: Pharmacist

## 2022-06-01 ENCOUNTER — Encounter: Payer: Self-pay | Admitting: Pharmacist

## 2022-06-01 ENCOUNTER — Telehealth: Payer: Self-pay | Admitting: Pharmacist

## 2022-06-01 DIAGNOSIS — I10 Essential (primary) hypertension: Secondary | ICD-10-CM

## 2022-06-01 DIAGNOSIS — E785 Hyperlipidemia, unspecified: Secondary | ICD-10-CM

## 2022-06-01 NOTE — Telephone Encounter (Signed)
LM on home phone but was able to reach patient on cell number. See telephone note.

## 2022-06-01 NOTE — Patient Instructions (Addendum)
Mr. Boughton It was a pleasure speaking with you today.  Below is a summary of our recent telephone visit.   High Blood Pressure   BP Readings from Last 3 Encounters:  05/10/22 (!) 164/78  12/22/21 138/68  11/13/21 (!) 152/70    - Blood pressure  goal is <140/90 - Continue to take amlodipine '5mg'$  every day - Check blood pressure daily over the next week and record. Bring to your appointment with Dr Yong Channel.  I have also included tips to help make sure you are getting the most accurate blood pressure reading form your home monitor.  - Remember to avoid the salt shaker. See below for more tips on how to decrease sodium / salt intake with diet changes.  - Try to restart walking regularly. Exercise goal is at least 150 minutes of moderate intensity physical activity weekly   Elevated cholesterol:  LDL (bad cholesterol) goal < 100  Lab Results  Component Value Goal Date   Total cholesterol  227 (H) Less than 200 05/10/2022   HDLC (good) cholesterol 44.10 Greater than 40 05/10/2022   LDL (bad) cholesterol 153 (H) Less than 100 05/10/2022   Triglycerides 150.0 (H) Less than 150 05/10/2022    - Consider trial of rosuvastatin '10mg'$  daily - discuss with Dr Yong Channel at your upcoming visit.    As always if you have any questions or concerns especially regarding medications, please feel free to contact me either at the phone number below or with a MyChart message.   Keep up the good work!  Cherre Robins, PharmD Clinical Pharmacist [facility]  [phone]        Low-Sodium Eating Plan Sodium, which is an element that makes up salt, helps you maintain a healthy balance of fluids in your body. Too much sodium can increase your blood pressure and cause fluid and waste to be held in your body. Your health care provider or dietitian may recommend following this plan if you have high blood pressure (hypertension), kidney disease, liver disease, or heart failure. Eating less sodium can  help lower your blood pressure, reduce swelling, and protect your heart, liver, and kidneys. What are tips for following this plan? Reading food labels The Nutrition Facts label lists the amount of sodium in one serving of the food. If you eat more than one serving, you must multiply the listed amount of sodium by the number of servings. Choose foods with less than 140 mg of sodium per serving. Avoid foods with 300 mg of sodium or more per serving. Shopping  Look for lower-sodium products, often labeled as "low-sodium" or "no salt added." Always check the sodium content, even if foods are labeled as "unsalted" or "no salt added." Buy fresh foods. Avoid canned foods and pre-made or frozen meals. Avoid canned, cured, or processed meats. Buy breads that have less than 80 mg of sodium per slice. Cooking  Eat more home-cooked food and less restaurant, buffet, and fast food. Avoid adding salt when cooking. Use salt-free seasonings or herbs instead of table salt or sea salt. Check with your health care provider or pharmacist before using salt substitutes. Cook with plant-based oils, such as canola, sunflower, or olive oil. Meal planning When eating at a restaurant, ask that your food be prepared with less salt or no salt, if possible. Avoid dishes labeled as brined, pickled, cured, smoked, or made with soy sauce, miso, or teriyaki sauce. Avoid foods that contain MSG (monosodium glutamate). MSG is sometimes added to [place],  bouillon, and some canned foods. Make meals that can be grilled, baked, poached, roasted, or steamed. These are generally made with less sodium. General information Most people on this plan should limit their sodium intake to 1,500-2,000 mg (milligrams) of sodium each day. What foods should I eat? Fruits Fresh, frozen, or canned fruit. Fruit juice. Vegetables Fresh or frozen vegetables. "No salt added" canned vegetables. "No salt added" tomato sauce and paste.  Low-sodium or reduced-sodium tomato and vegetable juice. Grains Low-sodium cereals, including oats, puffed wheat and rice, and shredded wheat. Low-sodium crackers. Unsalted rice. Unsalted pasta. Low-sodium bread. Whole-grain breads and whole-grain pasta. Meats and other proteins Fresh or frozen (no salt added) meat, poultry, seafood, and fish. Low-sodium canned tuna and salmon. Unsalted nuts. Dried peas, beans, and lentils without added salt. Unsalted canned beans. Eggs. Unsalted nut butters. Dairy Milk. Soy milk. Cheese that is naturally low in sodium, such as ricotta cheese, fresh mozzarella, or Swiss cheese. Low-sodium or reduced-sodium cheese. Cream cheese. Yogurt. Seasonings and condiments Fresh and dried herbs and spices. Salt-free seasonings. Low-sodium mustard and ketchup. Sodium-free salad dressing. Sodium-free light mayonnaise. Fresh or refrigerated horseradish. Lemon juice. Vinegar. Other foods Homemade, reduced-sodium, or low-sodium soups. Unsalted popcorn and pretzels. Low-salt or salt-free chips. The items listed above may not be a complete list of foods and beverages you can eat. Contact a dietitian for more information. What foods should I avoid? Vegetables Sauerkraut, pickled vegetables, and relishes. Olives. [place] fries. Onion rings. Regular canned vegetables (not low-sodium or reduced-sodium). Regular canned tomato sauce and paste (not low-sodium or reduced-sodium). Regular tomato and vegetable juice (not low-sodium or reduced-sodium). Frozen vegetables in sauces. Grains Instant hot cereals. Bread stuffing, pancake, and biscuit mixes. Croutons. Seasoned rice or pasta mixes. Noodle soup cups. Boxed or frozen macaroni and cheese. Regular salted crackers. Self-rising flour. Meats and other proteins Meat or fish that is salted, canned, smoked, spiced, or pickled. Precooked or cured meat, such as sausages or meat loaves. Berniece Salines. Ham. Pepperoni. Hot dogs. Corned beef. Chipped beef.  Salt pork. Jerky. Pickled herring. Anchovies and sardines. Regular canned tuna. Salted nuts. Dairy Processed cheese and cheese spreads. Hard cheeses. Cheese curds. Blue cheese. Feta cheese. String cheese. Regular cottage cheese. Buttermilk. Canned milk. Fats and oils Salted butter. Regular margarine. Ghee. Bacon fat. Seasonings and condiments Onion salt, garlic salt, seasoned salt, table salt, and sea salt. Canned and packaged gravies. Worcestershire sauce. Tartar sauce. Barbecue sauce. Teriyaki sauce. Soy sauce, including reduced-sodium. Steak sauce. Fish sauce. Oyster sauce. Cocktail sauce. Horseradish that you find on the shelf. Regular ketchup and mustard. Meat flavorings and tenderizers. Bouillon cubes. Hot sauce. Pre-made or packaged marinades. Pre-made or packaged taco seasonings. Relishes. Regular salad dressings. Salsa. Other foods Salted popcorn and pretzels. Corn chips and puffs. Potato and tortilla chips. Canned or dried soups. Pizza. Frozen entrees and pot pies. The items listed above may not be a complete list of foods and beverages you should avoid. Contact a dietitian for more information. Summary Eating less sodium can help lower your blood pressure, reduce swelling, and protect your heart, liver, and kidneys. Most people on this plan should limit their sodium intake to 1,500-2,000 mg (milligrams) of sodium each day. Canned, boxed, and frozen foods are high in sodium. Restaurant foods, fast foods, and pizza are also very high in sodium. You also get sodium by adding salt to food. Try to cook at home, eat more fresh fruits and vegetables, and eat less fast food and canned, processed, or prepared foods. This  information is not intended to replace advice given to you by your health care provider. Make sure you discuss any questions you have with your health care provider. Document Revised: 02/19/2019 Document Reviewed: 02/14/2019 Elsevier Patient Education  [place].

## 2022-06-01 NOTE — Progress Notes (Signed)
Patient appearing on report for [facility] - Hypertension Control report due to last documented ambulatory blood pressure of 164/78 on 05/10/2022. Next appointment with PCP is 06/08/2022   Outreached patient to discuss hypertension control and medication management.   Current antihypertensives: amlodipine '5mg'$  daily (increased from 2.'5mg'$  daily 05/10/2022)  Patient has an automated upper arm home BP machine. But only checks "when my wife reminds me"   Current blood pressure readings: none to report today  Current meal patterns: reports that he likes vegetables but he also likes to add salt to food.   Current physical activity: golf and bowling regularly. He was walking about 3 days per week with his wife but has not been walking regularly since wife starting having some back pain.   Patient denies hypotensive signs and symptoms including no dizziness, lightheadedness.  Patient denies hypertensive symptoms including no headache, chest pain, shortness of breath.  Patient denies side effects related to amlodipine - denies edema   Hyperlipidemia:  Patient was also to start rosuvastatin '10mg'$  daily after last lipids however patient declined to start. Today he states he "wanted to see what the amlodipine would do"  Lab Results  Component Value Date   CHOL 227 (H) 05/10/2022   HDL 44.10 05/10/2022   LDLCALC 153 (H) 05/10/2022   LDLDIRECT 100.0 06/01/2016   TRIG 150.0 (H) 05/10/2022   CHOLHDL 5 05/10/2022    PREVENT Risk Score: (scores below indicate high risk)  - 10 year risk of CVD: 27.1% - 10 year risk of ASCVD: 18.5% - 10 year risk of HF: 15.8%   Assessment/Plan: Hypertension:  Last office blood pressure not at goal of < 140/90 - - Reviewed goal blood pressure <140/90 - Reviewed appropriate home BP monitoring technique (avoid caffeine, smoking, and exercise for 30 minutes before checking, rest for at least 5 minutes before taking BP, sit with feet flat on the floor and back  against a hard surface, uncross legs, and rest arm on flat surface) - Reviewed to check blood pressure daily, document, and provide at next provider visit - Discussed dietary modifications, such as reduced salt intake, focus on whole grains, vegetables, lean proteins - Discussed goal of 150 minutes of moderate intensity physical activity weekly - Recommend continue amlodipine '5mg'$  daily    Hyperlipidemia: LDL goal < 100 - not currently at goal - discussed importance of statin therapy and lower LDL in prevention of CVD - explained that amlodipine would lower blood pressure which can lower CVD risk but that statin therapy worked differently and would further lower his CVD risk.  - patient to discuss further when he sees PCP next week.   Cherre Robins, PharmD Clinical Pharmacist [facility]

## 2022-06-04 ENCOUNTER — Telehealth: Payer: Self-pay

## 2022-06-04 NOTE — Telephone Encounter (Signed)
Called patient to schedule Medicare Annual Wellness Visit (AWV). Left message for patient to call back and schedule Medicare Annual Wellness Visit (AWV).  Last date of AWV: 03/02/19  Please schedule an AWVS appointment at any time with [place].    Norton Blizzard, [place] (AAMA)  [facility] [phone]

## 2022-06-08 ENCOUNTER — Encounter: Payer: Self-pay | Admitting: Family Medicine

## 2022-06-08 ENCOUNTER — Ambulatory Visit (INDEPENDENT_AMBULATORY_CARE_PROVIDER_SITE_OTHER): Payer: Medicare Other | Admitting: Family Medicine

## 2022-06-08 VITALS — BP 148/70 | HR 63 | Temp 97.0°F | Ht 67.0 in | Wt 188.2 lb

## 2022-06-08 DIAGNOSIS — I10 Essential (primary) hypertension: Secondary | ICD-10-CM

## 2022-06-08 DIAGNOSIS — E785 Hyperlipidemia, unspecified: Secondary | ICD-10-CM | POA: Diagnosis not present

## 2022-06-08 MED ORDER — AMLODIPINE BESYLATE 10 MG PO TABS: 10.0000 mg | ORAL_TABLET | Freq: Every day | ORAL | 3 refills | Status: DC

## 2022-06-08 NOTE — Patient Instructions (Addendum)
multiple home readings above 140 and above goal in office- no edema- we opted to increase amlodipine to 10 mg and recommended 1 month follow up for recheck  You are eligible to schedule your annual wellness visit with our nurse specialist Otila Kluver.  Please consider scheduling this before you leave today. She could update blood pressure and let me know if you preferred that option but would need to be in person.   Still think you need cholesterol medicine but we opted to focus on one change at a time  Recommended follow up: Return in about 1 month (around 07/09/2022) for followup or sooner if needed.Schedule b4 you leave.

## 2022-06-08 NOTE — Progress Notes (Signed)
  Phone [phone] In person visit   Subjective:   Jeffery [name] is a 72 y.o. year old very pleasant male patient who presents for/with See problem oriented charting Chief Complaint  Patient presents with   Follow-up   Hypertension   Past Medical History-  Patient Active Problem List   Diagnosis Date Noted   Hyperlipidemia 04/08/2015    Priority: Medium    History of colon cancer 11/05/2009    Priority: Medium    Hypertension 11/24/2006    Priority: Medium    ERECTILE DYSFUNCTION 11/24/2006    Medications- reviewed and updated Current Outpatient Medications  Medication Sig Dispense Refill   amLODipine (NORVASC) 5 MG tablet Take 1 tablet (5 mg total) by mouth daily. 90 tablet 3   Multiple Vitamin (MULTIVITAMIN) tablet Take 1 tablet by mouth daily.       naproxen sodium (ALEVE) 220 MG tablet Take 220 mg by mouth.     tadalafil (CIALIS) 5 MG tablet Take 1 tablet (5 mg total) by mouth daily as needed for erectile dysfunction. 30 tablet 5   No current facility-administered medications for this visit.     Objective:  BP (!) 148/70   Pulse 63   Temp (!) 97 F (36.1 C)   Ht 5\' 7"  (1.702 m)   Wt 188 lb 3.2 oz (85.4 kg)   SpO2 97%   BMI 29.48 kg/m  Gen: NAD, resting comfortably CV: RRR no murmurs rubs or gallops Lungs: CTAB no crackles, wheeze, rhonchi Ext: no edema Skin: warm, dry     Assessment and Plan   #Essential hypertension S: Medication: Amlodipine 5 mg Home readings #s: 151/75, 126/64, 149/61, 143/64 A/P: multiple home readings above 140 and above goal in office- no edema- we opted to increase amlodipine to 10 mg and recommended 1 month follow up for recheck   #hyperlipidemia S: Not on statin.  10-year ASCVD risk typically above 15% -Discussed CT cardiac scoring on 05/07/2021- he declined but is open to statin Lab Results  Component Value Date   CHOL 227 (H) 05/10/2022   HDL 44.10 05/10/2022   LDLCALC 153 (H) 05/10/2022   LDLDIRECT 100.0 06/01/2016    TRIG 150.0 (H) 05/10/2022   CHOLHDL 5 05/10/2022   A/P: Poor control-patient only wants to make 1 adjustment at a time so wants to hold off on starting statin until we stabilize blood pressure-I think that is a reasonable plan-reevaluate in 1 month  # Gross hematuria-noted September 2022 at another [place] office.  I recommended urology consultation but he has declined-aware of potential cancer risks.  Follow-up urine with no blood and no recurrence since that time-continue to recheck at least annually urinalysis with microscopic   Recommended follow up: Return in about 1 month (around 07/09/2022) for followup or sooner if needed.Schedule b4 you leave. Future Appointments  Date Time Provider [place]  07/20/2022 11:00 AM Marin Olp, MD [facility]    Lab/Order associations:   ICD-10-CM   1. Primary hypertension  I10     2. Hyperlipidemia, unspecified hyperlipidemia type  E78.5       Meds ordered this encounter  Medications   amLODipine (NORVASC) 10 MG tablet    Sig: Take 1 tablet (10 mg total) by mouth daily.    Dispense:  90 tablet    Refill:  3    Return precautions advised.  Garret Reddish, MD

## 2022-06-21 ENCOUNTER — Telehealth: Payer: Self-pay | Admitting: Family Medicine

## 2022-06-21 NOTE — Telephone Encounter (Signed)
Copied from [place] [id number]. Topic: Medicare AWV >> Jun 21, 2022  1:27 PM Gillis Santa wrote: Reason for CRM: Called patient to schedule Medicare Annual Wellness Visit (AWV). Left message for patient to call back and schedule Medicare Annual Wellness Visit (AWV).  Last date of AWV: 03/02/2019  Please schedule an appointment at any time with Otila Kluver, [facility] Please schedule AWVS with Otila Kluver, [place]  If any questions, please contact me at [phone].  Thank you ,  Shaune Pollack [facility] AWV TEAM Direct Dial [phone]

## 2022-07-20 ENCOUNTER — Encounter: Payer: Self-pay | Admitting: Family Medicine

## 2022-07-20 ENCOUNTER — Ambulatory Visit (INDEPENDENT_AMBULATORY_CARE_PROVIDER_SITE_OTHER): Payer: Medicare Other | Admitting: Family Medicine

## 2022-07-20 VITALS — BP 150/74 | HR 65 | Temp 98.3°F | Ht 67.0 in | Wt 184.6 lb

## 2022-07-20 DIAGNOSIS — I10 Essential (primary) hypertension: Secondary | ICD-10-CM | POA: Diagnosis not present

## 2022-07-20 MED ORDER — HYDROCHLOROTHIAZIDE 12.5 MG PO TABS: 12.5000 mg | ORAL_TABLET | Freq: Every day | ORAL | 5 refills | Status: DC

## 2022-07-20 NOTE — Patient Instructions (Signed)
unfortunately blood pressure still above ideal goal- prefer to at miniumum get him <140/90 if not 135/85 or 130/80- we opted to add hydrochlorothiazide 12.5 mg (low dose) and continue amlodipine 10 mg- follow up in 4-6 weeks

## 2022-07-20 NOTE — Progress Notes (Signed)
  Phone [phone] In person visit   Subjective:   Jeffery [name] is a 72 y.o. year old very pleasant male patient who presents for/with See problem oriented charting Chief Complaint  Patient presents with   Medical Management of Chronic Issues   Hypertension   Past Medical History-  Patient Active Problem List   Diagnosis Date Noted   Hyperlipidemia 04/08/2015    Priority: Medium    History of colon cancer 11/05/2009    Priority: Medium    Hypertension 11/24/2006    Priority: Medium    ERECTILE DYSFUNCTION 11/24/2006    Medications- reviewed and updated Current Outpatient Medications  Medication Sig Dispense Refill   amLODipine (NORVASC) 10 MG tablet Take 1 tablet (10 mg total) by mouth daily. 90 tablet 3   Multiple Vitamin (MULTIVITAMIN) tablet Take 1 tablet by mouth daily.       naproxen sodium (ALEVE) 220 MG tablet Take 220 mg by mouth.     tadalafil (CIALIS) 5 MG tablet Take 1 tablet (5 mg total) by mouth daily as needed for erectile dysfunction. 30 tablet 5   No current facility-administered medications for this visit.     Objective:  BP (!) 150/74   Pulse 65   Temp 98.3 F (36.8 C)   Ht  (1.702 m)   Wt 184 lb 9.6 oz (83.7 kg)   SpO2 97%   BMI 28.91 kg/m  Gen: NAD, resting comfortably CV: RRR no murmurs rubs or gallops Lungs: CTAB no crackles, wheeze, rhonchi Ext: minimal edema Skin: warm, dry     Assessment and Plan   #Essential hypertension S: Medication: Amlodipine 10 mg increased in 2024 Home readings #s: typically 140's/70s BP Readings from Last 3 Encounters:  07/20/22 (!) 150/74  06/08/22 (!) 148/70  05/10/22 (!) 164/78  A/P: unfortunately blood pressure still above ideal goal- prefer to at miniumum get him <140/90 if not 135/85 or 130/80- we opted to add hydrochlorothiazide 12.5 mg (low dose) and continue amlodipine 10 mg- follow up in 4-6 weeks - Likely also need statin but he wants to make 1 adjustment at a time-hold off on any  adjustments other than adding hydrochlorothiazide  Recommended follow up: Return in about 1 month (around 08/19/2022) for followup or sooner if needed.Schedule b4 you leave. Future Appointments  Date Time Provider Department Center  08/31/2022 11:00 AM Shelva Majestic, MD [facility]   Lab/Order associations:   ICD-10-CM   1. Primary hypertension  I10       Meds ordered this encounter  Medications   hydrochlorothiazide (HYDRODIURIL) 12.5 MG tablet    Sig: Take 1 tablet (12.5 mg total) by mouth daily.    Dispense:  30 tablet    Refill:  5    Return precautions advised.  Tana Conch, MD

## 2022-08-31 ENCOUNTER — Encounter: Payer: Self-pay | Admitting: Family Medicine

## 2022-08-31 ENCOUNTER — Ambulatory Visit (INDEPENDENT_AMBULATORY_CARE_PROVIDER_SITE_OTHER): Payer: Medicare Other | Admitting: Family Medicine

## 2022-08-31 VITALS — BP 150/68 | HR 80 | Temp 98.4°F | Ht 67.0 in | Wt 180.4 lb

## 2022-08-31 DIAGNOSIS — I10 Essential (primary) hypertension: Secondary | ICD-10-CM | POA: Diagnosis not present

## 2022-08-31 MED ORDER — HYDROCHLOROTHIAZIDE 25 MG PO TABS: 25.0000 mg | ORAL_TABLET | Freq: Every day | ORAL | 5 refills | Status: DC

## 2022-08-31 NOTE — Patient Instructions (Addendum)
-  we opted to increase hydrochlorothiazide to 25 mg (he can take 2 of the 12.5 mg tablets until he runs out) then recheck 4-6 weeks and at that time likely update bloodwork to check potassium and consider further workup if no improvement in blood pressure at that point - also he is going to elevate his arm at home up to heart level- has been laying it down in the chair when checking- may elevate it some at hoem   Still likely need cholesterol medicine but only doing one change at a time  Recommended follow up: Return in about 4 weeks (around 09/28/2022) for followup or sooner if needed.Schedule b4 you leave. Ok for up to 6-8 weeks if needed

## 2022-08-31 NOTE — Progress Notes (Signed)
  Phone [phone] In person visit   Subjective:   Jeffery [name] is a 72 y.o. year old very pleasant male patient who presents for/with See problem oriented charting Chief Complaint  Patient presents with   Medical Management of Chronic Issues   Hypertension    Pt is getting some elevated readings at home    Past Medical History-  Patient Active Problem List   Diagnosis Date Noted   Hyperlipidemia 04/08/2015    Priority: Medium    History of colon cancer 11/05/2009    Priority: Medium    Hypertension 11/24/2006    Priority: Medium    ERECTILE DYSFUNCTION 11/24/2006    Medications- reviewed and updated Current Outpatient Medications  Medication Sig Dispense Refill   amLODipine (NORVASC) 10 MG tablet Take 1 tablet (10 mg total) by mouth daily. 90 tablet 3   Multiple Vitamin (MULTIVITAMIN) tablet Take 1 tablet by mouth daily.       naproxen sodium (ALEVE) 220 MG tablet Take 220 mg by mouth.     hydrochlorothiazide (HYDRODIURIL) 25 MG tablet Take 1 tablet (25 mg total) by mouth daily. 30 tablet 5   tadalafil (CIALIS) 5 MG tablet Take 1 tablet (5 mg total) by mouth daily as needed for erectile dysfunction. (Patient not taking: Reported on 08/31/2022) 30 tablet 5   No current facility-administered medications for this visit.     Objective:  BP (!) 150/68   Pulse 80   Temp 98.4 F (36.9 C)   Ht 5\' 7"  (1.702 m)   Wt 180 lb 6.4 oz (81.8 kg)   SpO2 98%   BMI 28.25 kg/m  Gen: NAD, resting comfortably CV: RRR no murmurs rubs or gallops Lungs: CTAB no crackles, wheeze, rhonchi Ext: minimal  edema Skin: warm, dry     Assessment and Plan   #Essential hypertension S: Medication: Amlodipine 10 mg increased in 2024- takes in am, hydrochlorothiazide 12.5 mg- morning. Took medications 8 15 this am.  Home readings #s: typically 160's BP Readings from Last 3 Encounters:  08/31/22 (!) 150/68  07/20/22 (!) 150/74  06/08/22 (!) 148/70  A/P: blood pressure is similar on  systolic reading over last few vsiits despite increasing amlodipine and adding hydrochlorothiazide - diastolic is roughly similar as well.  -we opted to increase hydrochlorothiazide to 25 mg (he can take 2 of the 12.5 mg tablets until he runs out) then recheck 4-6 weeks and at that time likely update bloodwork to check potassium and consider further workup if no improvement in blood pressure at that point - also he is going to elevate his arm at home up to heart level- has been laying it down in the chair when checking- may elevate it some at hoem   Recommended follow up: Return in about 4 weeks (around 09/28/2022) for followup or sooner if needed.Schedule b4 you leave. No future appointments.  Lab/Order associations:   ICD-10-CM   1. Primary hypertension  I10       Meds ordered this encounter  Medications   hydrochlorothiazide (HYDRODIURIL) 25 MG tablet    Sig: Take 1 tablet (25 mg total) by mouth daily.    Dispense:  30 tablet    Refill:  5    Return precautions advised.  Tana Conch, MD

## 2022-09-29 ENCOUNTER — Encounter: Payer: Self-pay | Admitting: Family Medicine

## 2022-09-29 ENCOUNTER — Ambulatory Visit (INDEPENDENT_AMBULATORY_CARE_PROVIDER_SITE_OTHER): Payer: Medicare Other | Admitting: Family Medicine

## 2022-09-29 VITALS — BP 126/73 | HR 72 | Temp 98.2°F | Ht 67.0 in | Wt 185.2 lb

## 2022-09-29 DIAGNOSIS — E785 Hyperlipidemia, unspecified: Secondary | ICD-10-CM

## 2022-09-29 DIAGNOSIS — I10 Essential (primary) hypertension: Secondary | ICD-10-CM | POA: Diagnosis not present

## 2022-09-29 NOTE — Patient Instructions (Addendum)
Health Maintenance Due  Topic Date Due   Medicare Annual Wellness (AWV)  03/01/2020  You are eligible to schedule your annual wellness visit with our nurse specialist Inetta Fermo.  Please consider scheduling this before you leave today  blood pressure much improved- continue current medications with amlodipine 10 mg and hydrochlorothiazide 25 mg - would continue to check at least weekly or so and make sure <135/85 on average  Work on healthy eating and regular exercise- you wanted to hold off on cholesterol medicine and recheck next visit at physical  Recommended follow up: Return in about 6 months (around 04/01/2023) for physical or sooner if needed.Schedule b4 you leave.

## 2022-09-29 NOTE — Progress Notes (Signed)
  Phone [phone] In person visit   Subjective:   Jeffery [name] is a 72 y.o. year old very pleasant male patient who presents for/with See problem oriented charting Chief Complaint  Patient presents with   Medical Management of Chronic Issues   Hypertension   Past Medical History-  Patient Active Problem List   Diagnosis Date Noted   Hyperlipidemia 04/08/2015    Priority: Medium    History of colon cancer 11/05/2009    Priority: Medium    Hypertension 11/24/2006    Priority: Medium    ERECTILE DYSFUNCTION 11/24/2006    Medications- reviewed and updated Current Outpatient Medications  Medication Sig Dispense Refill   amLODipine (NORVASC) 10 MG tablet Take 1 tablet (10 mg total) by mouth daily. 90 tablet 3   hydrochlorothiazide (HYDRODIURIL) 25 MG tablet Take 1 tablet (25 mg total) by mouth daily. 30 tablet 5   Multiple Vitamin (MULTIVITAMIN) tablet Take 1 tablet by mouth daily.       naproxen sodium (ALEVE) 220 MG tablet Take 220 mg by mouth.     tadalafil (CIALIS) 5 MG tablet Take 1 tablet (5 mg total) by mouth daily as needed for erectile dysfunction. 30 tablet 5   No current facility-administered medications for this visit.     Objective:  BP 126/73   Pulse 72   Temp 98.2 F (36.8 C)   Ht 5\' 7"  (1.702 m)   Wt 185 lb 3.2 oz (84 kg)   SpO2 97%   BMI 29.01 kg/m  Gen: NAD, resting comfortably CV: RRR no murmurs rubs or gallops Lungs: CTAB no crackles, wheeze, rhonchi Ext: minimal edema Skin: warm, dry     Assessment and Plan   #Essential hypertension S: Medication: Amlodipine 10 mg increased in 2024, hydrochlorothiazide 12.5 mg--> 25 mg Home readings #s: improved- mostly 120s or 130s over 60's or 70s- last reading 126/63 A/P: blood pressure much improved- continue current medications with amlodipine 10 mg and hydrochlorothiazide 25 mg   #hyperlipidemia S: Not on statin.   -Discussed CT cardiac scoring on 05/07/2021- he declined but is open to statin The  10-year ASCVD risk score (Arnett DK, et al., 2019) is: 22.4% - remains active daily. Has cut down on sodium to help with blood pressure - doing a lot of fruits/veggies- doing some oatmeal as well Lab Results  Component Value Date   CHOL 227 (H) 05/10/2022   HDL 44.10 05/10/2022   LDLCALC 153 (H) 05/10/2022   LDLDIRECT 100.0 06/01/2016   TRIG 150.0 (H) 05/10/2022   CHOLHDL 5 05/10/2022  A/P: we discussed option of starting statin with elevated 10 year risk above 20%- he prefers to work on healthy eating and regular exercise and recheck at physical  Recommended follow up: Return in about 6 months (around 04/01/2023) for physical or sooner if needed.Schedule b4 you leave.  Lab/Order associations:   ICD-10-CM   1. Primary hypertension  I10     2. Hyperlipidemia, unspecified hyperlipidemia type  E78.5       No orders of the defined types were placed in this encounter.   Return precautions advised.  Tana Conch, MD

## 2023-03-25 ENCOUNTER — Other Ambulatory Visit: Payer: Self-pay | Admitting: Family Medicine

## 2023-04-13 ENCOUNTER — Ambulatory Visit: Payer: Medicare Other

## 2023-04-13 VITALS — Wt 185.0 lb

## 2023-04-13 DIAGNOSIS — Z0000 Encounter for general adult medical examination without abnormal findings: Secondary | ICD-10-CM | POA: Diagnosis not present

## 2023-04-13 NOTE — Patient Instructions (Addendum)
 Jeffery [name] , Thank you for taking time to come for your Medicare Wellness Visit. I appreciate your ongoing commitment to your health goals. Please review the following plan we discussed and let me know if I can assist you in the future.   Referrals/Orders/Follow-Ups/Clinician Recommendations: start going to the gym for silver sneakers  Aim for 30 minutes of exercise or brisk walking, 6-8 glasses of water , and 5 servings of fruits and vegetables each day.  Vaccinations: declines all Influenza vaccine: recommend every Fall Pneumococcal vaccine: recommend once per lifetime Prevnar-20 Tdap vaccine: recommend every 10 years Shingles vaccine: recommend Shingrix which is 2 doses 2-6 months apart and over 90% effective     Covid-19: recommend 2 doses one month apart with a booster 6 months later    This is a list of the screening recommended for you and due dates:  Health Maintenance  Topic Date Due   Zoster (Shingles) Vaccine (1 of 2) Never done   Pneumonia Vaccine (1 of 1 - PCV) Never done   Flu Shot  Never done   COVID-19 Vaccine (5 - 2024-25 season) 11/28/2022   Medicare Annual Wellness Visit  04/12/2024   Colon Cancer Screening  08/09/2024   Hepatitis C Screening  Completed   HPV Vaccine  Aged Out   DTaP/Tdap/Td vaccine  Discontinued    Advanced directives: (Copy Requested) Please bring a copy of your health care power of attorney and living will to the office to be added to your chart at your convenience.  Next Medicare Annual Wellness Visit scheduled for next year: Yes  This patient declined Interactive audio and video telecommunications. Therefore the visit was completed with audio only.

## 2023-04-13 NOTE — Progress Notes (Signed)
 Subjective:   Jeffery [name] is a 73 y.o. male who presents for Medicare Annual/Subsequent preventive examination.  Visit Complete: Virtual I connected with  Jeffery [name] on 04/13/23 by a audio enabled telemedicine application and verified that I am speaking with the correct person using two identifiers.  Patient Location: Home  Provider Location: Home Office  I discussed the limitations of evaluation and management by telemedicine. The patient expressed understanding and agreed to proceed.  Vital Signs: Because this visit was a virtual/telehealth visit, some criteria may be missing or patient reported. Any vitals not documented were not able to be obtained and vitals that have been documented are patient reported.   Cardiac Risk Factors include: advanced age (>59men, >83 women);hypertension;dyslipidemia;male gender     Objective:    Today's Vitals   04/13/23 1210  Weight: 185 lb (83.9 kg)   Body mass index is 28.98 kg/m.     04/13/2023   12:13 PM 03/02/2019    9:28 AM 10/06/2017   11:12 AM 01/09/2015    8:10 AM 10/19/2012    9:46 AM 09/20/2012    9:58 AM  Advanced Directives  Does Patient Have a Medical Advance Directive? Yes Yes Yes Yes Patient has advance directive, copy not in chart Patient has advance directive, copy not in chart  Type of Advance Directive Healthcare Power of [place];Living will Living will;Healthcare Power of [employer] Power of Attorney  Living will  Does patient want to make changes to medical advance directive?  No - Patient declined      Copy of Healthcare Power of Attorney in Chart? No - copy requested No - copy requested    Copy requested from family    Current Medications (verified) Outpatient Encounter Medications as of 04/13/2023  Medication Sig   amLODipine  (NORVASC ) 10 MG tablet Take 1 tablet (10 mg total) by mouth daily.   hydrochlorothiazide  (HYDRODIURIL ) 25 MG tablet TAKE 1 TABLET(25 MG) BY MOUTH DAILY   Multiple Vitamin  (MULTIVITAMIN) tablet Take 1 tablet by mouth daily.     naproxen sodium (ALEVE) 220 MG tablet Take 220 mg by mouth.   tadalafil  (CIALIS ) 5 MG tablet Take 1 tablet (5 mg total) by mouth daily as needed for erectile dysfunction.   No facility-administered encounter medications on file as of 04/13/2023.    Allergies (verified) Patient has no known allergies.   History: Past Medical History:  Diagnosis Date   Cataract    cataract extraction   Colon cancer (HCC) 08/23/2007   chemo   Past Surgical History:  Procedure Laterality Date   CATARACT EXTRACTION W/PHACO Right 09/27/2012   Procedure: CATARACT EXTRACTION PHACO AND INTRAOCULAR LENS PLACEMENT (IOC);  Surgeon: Jewel Mortimer, MD;  Location: [facility] OR;  Service: Ophthalmology;  Laterality: Right;   CATARACT EXTRACTION W/PHACO Left 11/01/2012   Procedure: CATARACT EXTRACTION PHACO AND INTRAOCULAR LENS PLACEMENT (IOC);  Surgeon: Jewel Mortimer, MD;  Location: [facility] OR;  Service: Ophthalmology;  Laterality: Left;   COLECTOMY  2009   colon cancer   SIGMOIDOSCOPY  01/09/2015   Family History  Problem Relation Age of Onset   Hypertension Mother    Hypertension Father    Colon cancer Neg Hx    Esophageal cancer Neg Hx    Rectal cancer Neg Hx    Stomach cancer Neg Hx    Colon polyps Neg Hx    Social History   Socioeconomic History   Marital status: Married    Spouse name: Not on file  Number of children: Not on file   Years of education: Not on file   Highest education level: Not on file  Occupational History   Not on file  Tobacco Use   Smoking status: Never   Smokeless tobacco: Never  Vaping Use   Vaping status: Never Used  Substance and Sexual Activity   Alcohol use: No    Alcohol/week: 0.0 standard drinks of alcohol   Drug use: No   Sexual activity: Not on file  Other Topics Concern   Not on file  Social History Narrative   Married. Wife bertha. 2 sons.   1 son has 1 grandson and a great granddaughter 4 in 2023. Aaron Aas Other son has  1 grandson who is 2 in 2023.       Retired from Health visitor: bowling, golfing      Helps to [employer] granddaughter    Social Drivers of Corporate investment banker Strain: Low Risk  (04/13/2023)   Overall Financial Resource Strain (CARDIA)    Difficulty of Paying Living Expenses: Not hard at all  Food Insecurity: No Food Insecurity (04/13/2023)   Hunger Vital Sign    Worried About Running Out of Food in the Last Year: Never true    Ran Out of Food in the Last Year: Never true  Transportation Needs: No Transportation Needs (04/13/2023)   PRAPARE - Administrator, Civil Service (Medical): No    Lack of Transportation (Non-Medical): No  Physical Activity: Sufficiently Active (04/13/2023)   Exercise Vital Sign    Days of Exercise per Week: 2 days    Minutes of Exercise per Session: 120 min  Stress: No Stress Concern Present (04/13/2023)   [employer] of Occupational Health - Occupational Stress Questionnaire    Feeling of Stress : Not at all  Social Connections: Socially Integrated (04/13/2023)   Social Connection and Isolation Panel [NHANES]    Frequency of Communication with Friends and Family: Three times a week    Frequency of Social Gatherings with Friends and Family: Three times a week    Attends Religious Services: More than 4 times per year    Active Member of Clubs or Organizations: Yes    Attends Banker Meetings: 1 to 4 times per year    Marital Status: Married    Tobacco Counseling Counseling given: Not Answered   Clinical Intake:  Pre-visit preparation completed: Yes  Pain : No/denies pain     BMI - recorded: 28.98 Nutritional Status: BMI 25 -29 Overweight Nutritional Risks: None Diabetes: No  How often do you need to have someone help you when you read instructions, pamphlets, or other written materials from your doctor or pharmacy?: 1 - Never  Interpreter Needed?: No  Information entered by :: Lamont Pilsner, LPN   Activities of Daily Living    04/13/2023   12:11 PM  In your present state of health, do you have any difficulty performing the following activities:  Hearing? 0  Vision? 0  Difficulty concentrating or making decisions? 0  Walking or climbing stairs? 0  Dressing or bathing? 0  Doing errands, shopping? 0  Preparing Food and eating ? N  Using the Toilet? N  In the past six months, have you accidently leaked urine? N  Do you have problems with loss of bowel control? N  Managing your Medications? N  Managing your Finances? N  Housekeeping or managing your Housekeeping? N  Patient Care Team: Almira Jaeger, MD as PCP - General (Family Medicine) Ben Bracken, MD as Consulting Physician (Ophthalmology)  Indicate any recent Medical Services you may have received from other than [facility] providers in the past year (date may be approximate).     Assessment:   This is a routine wellness examination for Jeffery [name].  Hearing/Vision screen Hearing Screening - Comments:: Pt denies any hearing issue  Vision Screening - Comments:: Pt follows up with Dr Gwendalyn Lemma for annual eye exams    Goals Addressed               This Visit's Progress     start going to the gym (pt-stated)         Depression Screen    04/13/2023   12:13 PM 06/08/2022   10:22 AM 05/10/2022   12:55 PM 05/07/2021   12:55 PM 07/04/2019    9:24 AM 03/02/2019    9:30 AM 10/06/2017   11:13 AM  PHQ 2/9 Scores  PHQ - 2 Score 0 0 0 0 0 0 0  PHQ- 9 Score  0 0 0       Fall Risk    04/13/2023   12:15 PM 06/08/2022   10:22 AM 05/10/2022   12:55 PM 12/22/2021   11:02 AM 12/16/2020   12:00 PM  Fall Risk   Falls in the past year? 1 0 0 0 1  Number falls in past yr: 1 0 0 0 0  Injury with Fall? 0 0 0 0 1  Risk for fall due to : History of fall(s) No Fall Risks No Fall Risks History of fall(s) History of fall(s)  Follow up Falls prevention discussed Falls evaluation completed Falls evaluation completed Falls  evaluation completed Falls evaluation completed    MEDICARE RISK AT HOME: Medicare Risk at Home Any stairs in or around the home?: Yes If so, are there any without handrails?: No Home free of loose throw rugs in walkways, pet beds, electrical cords, etc?: Yes Adequate lighting in your home to reduce risk of falls?: Yes Use of a cane, walker or w/c?: No Grab bars in the bathroom?: No Shower chair or bench in shower?: No Elevated toilet seat or a handicapped toilet?: No  TIMED UP AND GO:  Was the test performed?  No    Cognitive Function:    10/06/2017   11:16 AM  MMSE - Mini Mental State Exam  Not completed: --        04/13/2023   12:15 PM 03/02/2019    9:29 AM  6CIT Screen  What Year? 0 points 0 points  What month? 0 points 0 points  What time? 0 points 0 points  Count back from 20 0 points 0 points  Months in reverse 0 points 0 points  Repeat phrase 0 points 2 points  Total Score 0 points 2 points    Immunizations Immunization History  Administered Date(s) Administered   Moderna Covid-19 Vaccine Bivalent Booster 53yrs & up 01/02/2021   Moderna SARS-COV2 Booster Vaccination 02/14/2020   Moderna Sars-Covid-2 Vaccination 05/11/2019, 06/11/2019      Flu Vaccine status: Due, Education has been provided regarding the importance of this vaccine. Advised may receive this vaccine at local pharmacy or Health Dept. Aware to provide a copy of the vaccination record if obtained from local pharmacy or Health Dept. Verbalized acceptance and understanding.  Pneumococcal vaccine status: Due, Education has been provided regarding the importance of this vaccine. Advised may receive this vaccine at  local pharmacy or Health Dept. Aware to provide a copy of the vaccination record if obtained from local pharmacy or Health Dept. Verbalized acceptance and understanding.  Covid-19 vaccine status: Information provided on how to obtain vaccines.   Qualifies for Shingles Vaccine? Yes    Zostavax completed Yes   Shingrix Completed?: Yes  Screening Tests Health Maintenance  Topic Date Due   Zoster Vaccines- Shingrix (1 of 2) Never done   Pneumonia Vaccine 60+ Years old (1 of 1 - PCV) Never done   INFLUENZA VACCINE  Never done   COVID-19 Vaccine (5 - 2024-25 season) 11/28/2022   Medicare Annual Wellness (AWV)  04/12/2024   Colonoscopy  08/09/2024   Hepatitis C Screening  Completed   HPV VACCINES  Aged Out   DTaP/Tdap/Td  Discontinued    Health Maintenance  Health Maintenance Due  Topic Date Due   Zoster Vaccines- Shingrix (1 of 2) Never done   Pneumonia Vaccine 44+ Years old (1 of 1 - PCV) Never done   INFLUENZA VACCINE  Never done   COVID-19 Vaccine (5 - 2024-25 season) 11/28/2022    Colorectal cancer screening: Type of screening: Colonoscopy. Completed 08/10/19. Repeat every 5 years  Additional Screening:  Hepatitis C Screening:  Completed 02/03/10  Vision Screening: Recommended annual ophthalmology exams for early detection of glaucoma and other disorders of the eye. Is the patient up to date with their annual eye exam?  Yes  Who is the provider or what is the name of the office in which the patient attends annual eye exams? DR Gwendalyn Lemma  If pt is not established with a provider, would they like to be referred to a provider to establish care? No .   Dental Screening: Recommended annual dental exams for proper oral hygiene   Community Resource Referral / Chronic Care Management: CRR required this visit?  No   CCM required this visit?  No     Plan:     I have personally reviewed and noted the following in the patient's chart:   Medical and social history Use of alcohol, tobacco or illicit drugs  Current medications and supplements including opioid prescriptions. Patient is not currently taking opioid prescriptions. Functional ability and status Nutritional status Physical activity Advanced directives List of other  physicians Hospitalizations, surgeries, and ER visits in previous 12 months Vitals Screenings to include cognitive, depression, and falls Referrals and appointments  In addition, I have reviewed and discussed with patient certain preventive protocols, quality metrics, and best practice recommendations. A written personalized care plan for preventive services as well as general preventive health recommendations were provided to patient.     Bruno Capri, LPN   1/61/0960   After Visit Summary: (MyChart) Due to this being a telephonic visit, the after visit summary with patients personalized plan was offered to patient via MyChart   Nurse Notes: telephonic is pt preference

## 2023-04-20 ENCOUNTER — Encounter: Payer: Self-pay | Admitting: Family Medicine

## 2023-04-20 ENCOUNTER — Ambulatory Visit: Payer: Medicare Other | Admitting: Family Medicine

## 2023-04-20 VITALS — BP 132/68 | HR 75 | Temp 97.3°F | Ht 67.0 in | Wt 188.4 lb

## 2023-04-20 DIAGNOSIS — Z0001 Encounter for general adult medical examination with abnormal findings: Secondary | ICD-10-CM | POA: Diagnosis not present

## 2023-04-20 DIAGNOSIS — E663 Overweight: Secondary | ICD-10-CM | POA: Diagnosis not present

## 2023-04-20 DIAGNOSIS — E785 Hyperlipidemia, unspecified: Secondary | ICD-10-CM

## 2023-04-20 DIAGNOSIS — I499 Cardiac arrhythmia, unspecified: Secondary | ICD-10-CM

## 2023-04-20 DIAGNOSIS — Z131 Encounter for screening for diabetes mellitus: Secondary | ICD-10-CM | POA: Diagnosis not present

## 2023-04-20 DIAGNOSIS — I10 Essential (primary) hypertension: Secondary | ICD-10-CM | POA: Diagnosis not present

## 2023-04-20 DIAGNOSIS — Z87448 Personal history of other diseases of urinary system: Secondary | ICD-10-CM | POA: Diagnosis not present

## 2023-04-20 LAB — COMPREHENSIVE METABOLIC PANEL
ALT: 11 U/L (ref 0–53)
AST: 20 U/L (ref 0–37)
Albumin: 4.6 g/dL (ref 3.5–5.2)
Alkaline Phosphatase: 48 U/L (ref 39–117)
BUN: 13 mg/dL (ref 6–23)
CO2: 28 meq/L (ref 19–32)
Calcium: 9.2 mg/dL (ref 8.4–10.5)
Chloride: 102 meq/L (ref 96–112)
Creatinine, Ser: 1.23 mg/dL (ref 0.40–1.50)
GFR: 57.7 mL/min — ABNORMAL LOW (ref >60.00)
Glucose, Bld: 103 mg/dL — ABNORMAL HIGH (ref 70–99)
Potassium: 3.8 meq/L (ref 3.5–5.1)
Sodium: 138 meq/L (ref 135–145)
Total Bilirubin: 0.4 mg/dL (ref 0.2–1.2)
Total Protein: 7.8 g/dL (ref 6.0–8.3)

## 2023-04-20 LAB — URINALYSIS, ROUTINE W REFLEX MICROSCOPIC
Bilirubin Urine: NEGATIVE
Hgb urine dipstick: NEGATIVE
Ketones, ur: NEGATIVE
Leukocytes,Ua: NEGATIVE
Nitrite: NEGATIVE
RBC / HPF: NONE SEEN (ref 0–?)
Specific Gravity, Urine: 1.015 (ref 1.000–1.030)
Total Protein, Urine: NEGATIVE
Urine Glucose: NEGATIVE
Urobilinogen, UA: 0.2 (ref 0.0–1.0)
WBC, UA: NONE SEEN (ref 0–?)
pH: 6.5 (ref 5.0–8.0)

## 2023-04-20 LAB — CBC WITH DIFFERENTIAL/PLATELET
Basophils Absolute: 0 10*3/uL (ref 0.0–0.1)
Basophils Relative: 0.9 % (ref 0.0–3.0)
Eosinophils Absolute: 0.2 10*3/uL (ref 0.0–0.7)
Eosinophils Relative: 4.8 % (ref 0.0–5.0)
HCT: 41 % (ref 39.0–52.0)
Hemoglobin: 13.1 g/dL (ref 13.0–17.0)
Lymphocytes Relative: 25.8 % (ref 12.0–46.0)
Lymphs Abs: 1.3 10*3/uL (ref 0.7–4.0)
MCHC: 31.9 g/dL (ref 30.0–36.0)
MCV: 83.6 fL (ref 78.0–100.0)
Monocytes Absolute: 0.5 10*3/uL (ref 0.1–1.0)
Monocytes Relative: 9.5 % (ref 3.0–12.0)
Neutro Abs: 2.9 10*3/uL (ref 1.4–7.7)
Neutrophils Relative %: 59 % (ref 43.0–77.0)
Platelets: 218 10*3/uL (ref 150.0–400.0)
RBC: 4.9 Mil/uL (ref 4.22–5.81)
RDW: 14.3 % (ref 11.5–15.5)
WBC: 4.9 10*3/uL (ref 4.0–10.5)

## 2023-04-20 LAB — LIPID PANEL
Cholesterol: 231 mg/dL — ABNORMAL HIGH (ref 0–200)
HDL: 45.4 mg/dL (ref >39.00)
LDL Cholesterol: 151 mg/dL — ABNORMAL HIGH (ref 0–99)
NonHDL: 185.95
Total CHOL/HDL Ratio: 5
Triglycerides: 177 mg/dL — ABNORMAL HIGH (ref 0.0–149.0)
VLDL: 35.4 mg/dL (ref 0.0–40.0)

## 2023-04-20 LAB — HEMOGLOBIN A1C: Hgb A1c MFr Bld: 6.5 % (ref 4.6–6.5)

## 2023-04-20 LAB — TSH: TSH: 2.02 u[IU]/mL (ref 0.35–5.50)

## 2023-04-20 NOTE — Progress Notes (Signed)
Phone: [phone]   Subjective:  Patient presents today for their annual physical. Chief complaint-noted.   See problem oriented charting- ROS- full  review of systems was completed and negative  except for: ongoing pain in right hip- staying active but bothering him- can set him back for a week and has to rest- as of now wants to hold off on further workup  The following were reviewed and entered/updated in epic: Past Medical History:  Diagnosis Date   Cataract    cataract extraction   Colon cancer (HCC) 08/23/2007   chemo   Patient Active Problem List   Diagnosis Date Noted   Hyperlipidemia 04/08/2015    Priority: Medium    History of colon cancer 11/05/2009    Priority: Medium    Hypertension 11/24/2006    Priority: Medium    ERECTILE DYSFUNCTION 11/24/2006   Past Surgical History:  Procedure Laterality Date   CATARACT EXTRACTION W/PHACO Right 09/27/2012   Procedure: CATARACT EXTRACTION PHACO AND INTRAOCULAR LENS PLACEMENT (IOC);  Surgeon: Shade Flood, MD;  Location: [facility] OR;  Service: Ophthalmology;  Laterality: Right;   CATARACT EXTRACTION W/PHACO Left 11/01/2012   Procedure: CATARACT EXTRACTION PHACO AND INTRAOCULAR LENS PLACEMENT (IOC);  Surgeon: Shade Flood, MD;  Location: [facility] OR;  Service: Ophthalmology;  Laterality: Left;   COLECTOMY  2009   colon cancer   SIGMOIDOSCOPY  01/09/2015    Family History  Problem Relation Age of Onset   Hypertension Mother    Hypertension Father    Colon cancer Neg Hx    Esophageal cancer Neg Hx    Rectal cancer Neg Hx    Stomach cancer Neg Hx    Colon polyps Neg Hx     Medications- reviewed and updated Current Outpatient Medications  Medication Sig Dispense Refill   amLODipine (NORVASC) 10 MG tablet Take 1 tablet (10 mg total) by mouth daily. 90 tablet 3   hydrochlorothiazide (HYDRODIURIL) 25 MG tablet TAKE 1 TABLET(25 MG) BY MOUTH DAILY 30 tablet 5   Multiple Vitamin (MULTIVITAMIN) tablet Take 1 tablet by mouth daily.        naproxen sodium (ALEVE) 220 MG tablet Take 220 mg by mouth.     tadalafil (CIALIS) 5 MG tablet Take 1 tablet (5 mg total) by mouth daily as needed for erectile dysfunction. (Patient not taking: Reported on 04/20/2023) 30 tablet 5   No current facility-administered medications for this visit.    Allergies-reviewed and updated No Known Allergies  Social History   Social History Narrative   Married. Wife bertha. 2 sons.   1 son has 1 grandson and a great granddaughter 4 in 2023. Marland Kitchen Other son has 1 grandson who is 2 in 2023.       Retired from Health visitor: bowling, golfing      Helps to [employer] great granddaughter    Objective  Objective:  BP 132/68   Pulse 75   Temp (!) 97.3 F (36.3 C)   Ht 5\' 7"  (1.702 m)   Wt 188 lb 6.4 oz (85.5 kg)   SpO2 97%   BMI 29.51 kg/m  Gen: NAD, resting comfortably HEENT: Mucous membranes are moist. Oropharynx normal Neck: no thyromegaly CV: Heart rate in normal range but ectopic beats noted-no murmurs rubs or gallops Lungs: CTAB no crackles, wheeze, rhonchi Abdomen: soft/nontender/nondistended/normal bowel sounds. No rebound or guarding.  Ext: no edema Skin: warm, dry Neuro: grossly normal, moves all extremities, pupil reactive on right, blown on left per  baseline  EKG: sinus bradycardia rhythm with rate 56, normal axis, normal intervals, voltge criteria for LVH in I and III but no corresponding st or t wave changes    Assessment and Plan  73 y.o. male presenting for annual physical.  Health Maintenance counseling: 1. Anticipatory guidance: Patient counseled regarding regular dental exams -q6 months, eye exams -yearly,  avoiding smoking and second hand smoke, limiting alcohol to 2 beverages per day - doesn't drink, no illicit drugs .   2. Risk factor reduction:  Advised patient of need for regular exercise and diet rich and fruits and vegetables to reduce risk of heart attack and stroke.  Exercise-walks some, mows, plays  golf-recommended 150 minutes/week on average- has maintained some but as noted above- hip can get in way of goal.  Diet/weight management-Down 2 pounds from last physical and was down 8 pounds last year-still eating a lot of fresh foods his brother works for [employer] - has continued [employer]- agree with ongoing gradual weight loss.  Wt Readings from Last 3 Encounters:  04/20/23 188 lb 6.4 oz (85.5 kg)  04/13/23 185 lb (83.9 kg)  09/29/22 185 lb 3.2 oz (84 kg)  3. Immunizations/screenings/ancillary studies-discussed Shingrix at pharmacy, Prevnar 20, Tdap, flu and COVID ( held off at this time because couldn't find moderna)- declines all of these. Discussed Tetanus, Diphtheria, and Pertussis (Tdap) can be done after the fact.   Immunization History  Administered Date(s) Administered   Moderna Covid-19 Vaccine Bivalent Booster 69yrs & up 01/02/2021   Moderna SARS-COV2 Booster Vaccination 02/14/2020   Moderna Sars-Covid-2 Vaccination 05/11/2019, 06/11/2019  4. Prostate cancer screening- low risk prior trend- we discontinued checks on PSA. No change in symptoms  Lab Results  Component Value Date   PSA 0.86 05/07/2021   PSA 1.50 12/16/2020   PSA 0.93 07/04/2019  5. Colon cancer screening - per Dr. Craige Cotta 08/16/21-he is not due until 2026 This patient underwent a subtotal colectomy in 2009 for colon cancer. He was polyp free on his last 2 flex sigmoidoscopies. He is appropriately scheduled for a surveillance flex sigmoidoscopy on a 5 year interval which is due in May 2026.    6. Skin cancer screening- lower risk due to melanin content advised regular sunscreen use. Denies worrisome, changing, or new skin lesions. Wart on wrist- better with OTC (available over the counter without a prescription) treatment  7. Smoking associated screening (lung cancer screening, AAA screen 65-75, UA)- never smoker 8. STD screening - only active with wife   Status of chronic or acute concerns   # Irregular heart  rhythm-noted on exam.  We discussed EKG to rule out A-fib or other dangerous rhythms-he is asymptomatic currently. No obvious cause on EKG and we discussed possible monitoring but he opts out for now   #donated blood in first week of December  #Essential hypertension S: Medication: Amlodipine 10 mg increased in 2024, hydrochlorothiazide 12.5 mg--> 25 mg  Home readings #s: usually 120s or so with 70s diastolic BP Readings from Last 3 Encounters:  04/20/23 132/68  09/29/22 126/73  08/31/22 (!) 150/68  A/P: blood pressure well controlled continue current medications    #hyperlipidemia S: Not on statin.  10-year ASCVD risk typically above 15% -Discussed CT cardiac scoring on 05/07/2021- he declined but is open to statin Lab Results  Component Value Date   CHOL 227 (H) 05/10/2022   HDL 44.10 05/10/2022   LDLCALC 153 (H) 05/10/2022   LDLDIRECT 100.0 06/01/2016   TRIG 150.0 (H)  05/10/2022   CHOLHDL 5 05/10/2022  A/P: update cholesterol today and consider statin if risk still above 20%- would use low dose maybe rosuvastatin 5 mg daily  #ED- cialis 5 mg daily as needed is helpful    #Allergies-no Rx.  Preferred to tolerate   #prior  # Gross hematuria-noted September 2022 at another  office.  I recommended urology consultation but he has declined-aware of potential cancer risks.  Follow-up urine with no blood and no recurrence since that time-continue to recheck at least annually urinalysis with microscopic- check today   Recommended follow up: Return in about 6 months (around 10/18/2023) for followup or sooner if needed.Schedule b4 you leave. Or 1 year if blood pressure looks good at home and no other concerns  Lab/Order associations: fasting   ICD-10-CM   1. Preventative health care  Z00.00     2. Screening for diabetes mellitus  Z13.1 Hemoglobin A1c    3. Overweight  E66.3 Hemoglobin A1c    4. Primary hypertension  I10 Comprehensive metabolic panel    CBC with  Differential/Platelet    Lipid panel    5. Hyperlipidemia, unspecified hyperlipidemia type  E78.5 Comprehensive metabolic panel    CBC with Differential/Platelet    Lipid panel    6. History of hematuria  Z87.448 Urinalysis, Routine w reflex microscopic      No orders of the defined types were placed in this encounter.   Return precautions advised.  Tana Conch, MD

## 2023-04-20 NOTE — Patient Instructions (Addendum)
Please stop by lab before you go If you have mychart- we will send your results within 3 business days of [place] receiving them.  If you do not have mychart- we will call you about results within 5 business days of [place] receiving them.  *please also note that you will see labs on mychart as soon as they post. I will later go in and write notes on them- will say "notes from Dr. Durene Cal"   EKG without any abnormal beats and overall pretty reassuring- just slightly slow rhythm. I'm going to check thyroid to see if contributing at all. We could do cardiac monitoring with a monitor you wear- wanted to discuss with you  Recommended follow up: Return in about 6 months (around 10/18/2023) for followup or sooner if needed.Schedule b4 you leave.  Or 1 year if blood pressure looks good at home and no other concerns

## 2023-09-06 ENCOUNTER — Other Ambulatory Visit: Payer: Self-pay | Admitting: Family Medicine

## 2023-10-18 ENCOUNTER — Ambulatory Visit: Payer: Self-pay | Admitting: Family Medicine

## 2023-10-18 ENCOUNTER — Ambulatory Visit (INDEPENDENT_AMBULATORY_CARE_PROVIDER_SITE_OTHER): Payer: Medicare Other | Admitting: Family Medicine

## 2023-10-18 ENCOUNTER — Other Ambulatory Visit (HOSPITAL_COMMUNITY): Payer: Self-pay

## 2023-10-18 ENCOUNTER — Telehealth: Payer: Self-pay

## 2023-10-18 ENCOUNTER — Encounter: Payer: Self-pay | Admitting: Family Medicine

## 2023-10-18 VITALS — BP 158/78 | HR 61 | Temp 97.9°F | Ht 67.0 in | Wt 183.4 lb

## 2023-10-18 DIAGNOSIS — I10 Essential (primary) hypertension: Secondary | ICD-10-CM | POA: Diagnosis not present

## 2023-10-18 DIAGNOSIS — E785 Hyperlipidemia, unspecified: Secondary | ICD-10-CM | POA: Diagnosis not present

## 2023-10-18 DIAGNOSIS — E663 Overweight: Secondary | ICD-10-CM | POA: Diagnosis not present

## 2023-10-18 DIAGNOSIS — Z131 Encounter for screening for diabetes mellitus: Secondary | ICD-10-CM | POA: Diagnosis not present

## 2023-10-18 LAB — COMPREHENSIVE METABOLIC PANEL WITH GFR
ALT: 11 U/L (ref 0–53)
AST: 20 U/L (ref 0–37)
Albumin: 4.6 g/dL (ref 3.5–5.2)
Alkaline Phosphatase: 47 U/L (ref 39–117)
BUN: 13 mg/dL (ref 6–23)
CO2: 28 meq/L (ref 19–32)
Calcium: 9.1 mg/dL (ref 8.4–10.5)
Chloride: 102 meq/L (ref 96–112)
Creatinine, Ser: 1.39 mg/dL (ref 0.40–1.50)
GFR: 49.65 mL/min — ABNORMAL LOW (ref >60.00)
Glucose, Bld: 105 mg/dL — ABNORMAL HIGH (ref 70–99)
Potassium: 3.9 meq/L (ref 3.5–5.1)
Sodium: 138 meq/L (ref 135–145)
Total Bilirubin: 0.5 mg/dL (ref 0.2–1.2)
Total Protein: 7.6 g/dL (ref 6.0–8.3)

## 2023-10-18 LAB — CBC WITH DIFFERENTIAL/PLATELET
Basophils Absolute: 0 K/uL (ref 0.0–0.1)
Basophils Relative: 0.7 % (ref 0.0–3.0)
Eosinophils Absolute: 0.3 K/uL (ref 0.0–0.7)
Eosinophils Relative: 5.1 % — ABNORMAL HIGH (ref 0.0–5.0)
HCT: 39.6 % (ref 39.0–52.0)
Hemoglobin: 12.5 g/dL — ABNORMAL LOW (ref 13.0–17.0)
Lymphocytes Relative: 28.5 % (ref 12.0–46.0)
Lymphs Abs: 1.5 K/uL (ref 0.7–4.0)
MCHC: 31.6 g/dL (ref 30.0–36.0)
MCV: 80.3 fl (ref 78.0–100.0)
Monocytes Absolute: 0.6 K/uL (ref 0.1–1.0)
Monocytes Relative: 10.8 % (ref 3.0–12.0)
Neutro Abs: 2.9 K/uL (ref 1.4–7.7)
Neutrophils Relative %: 54.9 % (ref 43.0–77.0)
Platelets: 203 K/uL (ref 150.0–400.0)
RBC: 4.94 Mil/uL (ref 4.22–5.81)
RDW: 16 % — ABNORMAL HIGH (ref 11.5–15.5)
WBC: 5.3 K/uL (ref 4.0–10.5)

## 2023-10-18 LAB — LDL CHOLESTEROL, DIRECT: Direct LDL: 136 mg/dL

## 2023-10-18 LAB — HEMOGLOBIN A1C: Hgb A1c MFr Bld: 6.7 % — ABNORMAL HIGH (ref 4.6–6.5)

## 2023-10-18 MED ORDER — AMLODIPINE BESYLATE 10 MG PO TABS: 10.0000 mg | ORAL_TABLET | Freq: Every day | ORAL | 3 refills | Status: AC

## 2023-10-18 MED ORDER — TADALAFIL 5 MG PO TABS: 5.0000 mg | ORAL_TABLET | Freq: Every day | ORAL | 5 refills | Status: DC | PRN

## 2023-10-18 MED ORDER — ROSUVASTATIN CALCIUM 10 MG PO TABS: 10.0000 mg | ORAL_TABLET | ORAL | 3 refills | Status: AC

## 2023-10-18 NOTE — Progress Notes (Signed)
 Phone [phone] In person visit   Subjective:   Jeffery [name] is a 73 y.o. year old very pleasant male patient who presents for/with See problem oriented charting Chief Complaint  Patient presents with   Medical Management of Chronic Issues   Hypertension    Past Medical History-  Patient Active Problem List   Diagnosis Date Noted   Hyperlipidemia 04/08/2015    Priority: Medium    History of colon cancer 11/05/2009    Priority: Medium    Hypertension 11/24/2006    Priority: Medium    ERECTILE DYSFUNCTION 11/24/2006    Medications- reviewed and updated Current Outpatient Medications  Medication Sig Dispense Refill   hydrochlorothiazide  (HYDRODIURIL ) 25 MG tablet TAKE 1 TABLET(25 MG) BY MOUTH DAILY 30 tablet 5   Multiple Vitamin (MULTIVITAMIN) tablet Take 1 tablet by mouth daily.       naproxen sodium (ALEVE) 220 MG tablet Take 220 mg by mouth.     rosuvastatin  (CRESTOR ) 10 MG tablet Take 1 tablet (10 mg total) by mouth once a week. 13 tablet 3   amLODipine  (NORVASC ) 10 MG tablet Take 1 tablet (10 mg total) by mouth daily. 90 tablet 3   tadalafil  (CIALIS ) 5 MG tablet Take 1 tablet (5 mg total) by mouth daily as needed for erectile dysfunction. 30 tablet 5   No current facility-administered medications for this visit.     Objective:  BP (!) 158/78   Pulse 61   Temp 97.9 F (36.6 C)   Ht 5' 7 (1.702 m)   Wt 183 lb 6.4 oz (83.2 kg)   SpO2 96%   BMI 28.72 kg/m  Gen: NAD, resting comfortably CV: RRR no murmurs rubs or gallops Lungs: CTAB no crackles, wheeze, rhonchi Abdomen: soft/nontender/nondistended/normal bowel sounds. No rebound or guarding.  Ext: no edema Skin: warm, dry Neuro: grossly normal, moves all extremities     Assessment and Plan   #Essential hypertension S: Medication: hydrochlorothiazide  25 mg  -has not been getting the amlodipine  as planned- last refill was march 2024 Home readings #s: still 120s over 70s despite stopping amlodipine  BP  Readings from Last 3 Encounters:  10/18/23 (!) 158/78  04/20/23 132/68  09/29/22 126/73  A/P: blood pressure looks good at home but pretty elevated here off of amlodipine . We opted to restart the amlodipine  10 mg and continue hydrochlorothiazide  and have him update me with home blood pressure in 2-3 weeks   #hyperlipidemia S: Not on statin.  10-year ASCVD risk typically above 15% at least -Discussed CT cardiac scoring on 05/07/2021- he declined and again last visit declined- we stent in statin but he opted out with diabetes risk - not interested in vegetarian/vegan diet - did lose 5 lbs from last visit- summer is a good time for him with push mowing Lab Results  Component Value Date   CHOL 231 (H) 04/20/2023   HDL 45.40 04/20/2023   LDLCALC 151 (H) 04/20/2023   LDLDIRECT 100.0 06/01/2016   TRIG 177.0 (H) 04/20/2023   CHOLHDL 5 04/20/2023  A/P: lipids- update direct LDL today - offered CT calcium  again and declines- and wants to hold off on medicine initially ut then we discussed once weekly option rosuvastatin  10 mg and he opts in  # Hyperglycemia/insulin resistance/prediabetes- one time peak a1c of 6.5 S:  Medication: none Exercise and diet- very active in the yard with push mower- active with grandkids. Some golf. Has tried to reduce carbohydrates and sweets   Lab Results  Component Value Date  HGBA1C 6.5 04/20/2023   A/P: hopefully improved- if a1c is 6.5 or above again would need to diagnose diabetes but as long as under 7 could work on lifestyle without medications- he prefers to hold off on medications if possible   #History of colon cancer-last colonoscopy 2016 and per DR. Aneita This patient underwent a subtotal colectomy in 2009 for colon cancer. He was polyp free on his last 2 flex sigmoidoscopies. He is appropriately scheduled for a surveillance flex sigmoidoscopy on a 5 year interval which is due in May 2026. If he has new LGI symptoms or other GI concerns we should evaluate  him in the office.  - no bright red blood per rectum or melena- will be due back next year   #ED- cialis  5 mg daily is helpful  but expensive before- advised to trial goodrx if expensive on his plan- printed rx   Recommended follow up: Return in about 6 months (around 04/19/2024) for physical or sooner if needed.Schedule b4 you leave. No future appointments.  Lab/Order associations:   ICD-10-CM   1. Primary hypertension  I10     2. Hyperlipidemia, unspecified hyperlipidemia type  E78.5 Comprehensive metabolic panel with GFR    CBC with Differential/Platelet    LDL cholesterol, direct    3. Screening for diabetes mellitus  Z13.1 Hemoglobin A1c    4. Overweight  E66.3 Hemoglobin A1c      Meds ordered this encounter  Medications   amLODipine  (NORVASC ) 10 MG tablet    Sig: Take 1 tablet (10 mg total) by mouth daily.    Dispense:  90 tablet    Refill:  3   tadalafil  (CIALIS ) 5 MG tablet    Sig: Take 1 tablet (5 mg total) by mouth daily as needed for erectile dysfunction.    Dispense:  30 tablet    Refill:  5   rosuvastatin  (CRESTOR ) 10 MG tablet    Sig: Take 1 tablet (10 mg total) by mouth once a week.    Dispense:  13 tablet    Refill:  3    Return precautions advised.  Garnette Lukes, MD

## 2023-10-18 NOTE — Patient Instructions (Addendum)
 blood pressure looks good at home but pretty elevated here off of amlodipine . We opted to restart the amlodipine  10 mg and continue hydrochlorothiazide  and have him update me with home blood pressure in 2-3 weeks  cialis  5 mg daily is helpful  but expensive before- advised to trial goodrx if expensive on his plan  lipids- update direct LDL today - offered CT calcium  again and declines- and wants to hold off on medicine initially ut then we discussed once weekly option rosuvastatin  10 mg and he opts in  Please stop by lab before you go If you have mychart- we will send your results within 3 business days of us  receiving them.  If you do not have mychart- we will call you about results within 5 business days of us  receiving them.  *please also note that you will see labs on mychart as soon as they post. I will later go in and write notes on them- will say notes from Dr. Katrinka   Recommended follow up: Return in about 6 months (around 04/19/2024) for physical or sooner if needed.Schedule b4 you leave.

## 2023-10-18 NOTE — Telephone Encounter (Signed)
 Pharmacy Patient Advocate Encounter  Received notification from [employer] that Prior Authorization for Tadalafil  5MG  tablets has been DENIED.  Full denial letter will be uploaded to the media tab. See denial reason below.   PA #/Case ID/Reference #: [id number]

## 2023-10-18 NOTE — Telephone Encounter (Signed)
 Pharmacy Patient Advocate Encounter   Received notification from Onbase that prior authorization for Tadalafil  5MG  tablets is required/requested.   Insurance verification completed.   The patient is insured through [facility] .   Per test claim: PA required; PA submitted to above mentioned insurance via CoverMyMeds Key/confirmation #/EOC [facility] Status is pending

## 2023-11-10 ENCOUNTER — Emergency Department (HOSPITAL_BASED_OUTPATIENT_CLINIC_OR_DEPARTMENT_OTHER)

## 2023-11-10 ENCOUNTER — Emergency Department (HOSPITAL_BASED_OUTPATIENT_CLINIC_OR_DEPARTMENT_OTHER)
Admission: EM | Admit: 2023-11-10 | Discharge: 2023-11-10 | Disposition: A | Discharge status: Home / Self Care | Attending: Emergency Medicine | Admitting: Emergency Medicine

## 2023-11-10 ENCOUNTER — Other Ambulatory Visit: Payer: Self-pay

## 2023-11-10 DIAGNOSIS — N179 Acute kidney failure, unspecified: Secondary | ICD-10-CM | POA: Insufficient documentation

## 2023-11-10 DIAGNOSIS — K5900 Constipation, unspecified: Secondary | ICD-10-CM | POA: Insufficient documentation

## 2023-11-10 DIAGNOSIS — I10 Essential (primary) hypertension: Secondary | ICD-10-CM | POA: Diagnosis not present

## 2023-11-10 DIAGNOSIS — R1030 Lower abdominal pain, unspecified: Secondary | ICD-10-CM | POA: Diagnosis not present

## 2023-11-10 DIAGNOSIS — N132 Hydronephrosis with renal and ureteral calculous obstruction: Secondary | ICD-10-CM | POA: Insufficient documentation

## 2023-11-10 DIAGNOSIS — N134 Hydroureter: Secondary | ICD-10-CM | POA: Diagnosis not present

## 2023-11-10 DIAGNOSIS — R109 Unspecified abdominal pain: Secondary | ICD-10-CM | POA: Diagnosis present

## 2023-11-10 DIAGNOSIS — N200 Calculus of kidney: Secondary | ICD-10-CM | POA: Diagnosis not present

## 2023-11-10 LAB — URINALYSIS, ROUTINE W REFLEX MICROSCOPIC
Bilirubin Urine: NEGATIVE
Glucose, UA: NEGATIVE mg/dL
Ketones, ur: NEGATIVE mg/dL
Leukocytes,Ua: NEGATIVE
Nitrite: NEGATIVE
Protein, ur: NEGATIVE mg/dL
Specific Gravity, Urine: 1.018 (ref 1.005–1.030)
pH: 5 (ref 5.0–8.0)

## 2023-11-10 LAB — CBC
HCT: 36.2 % — ABNORMAL LOW (ref 39.0–52.0)
Hemoglobin: 11.8 g/dL — ABNORMAL LOW (ref 13.0–17.0)
MCH: 25.8 pg — ABNORMAL LOW (ref 26.0–34.0)
MCHC: 32.6 g/dL (ref 30.0–36.0)
MCV: 79.2 fL — ABNORMAL LOW (ref 80.0–100.0)
Platelets: 246 K/uL (ref 150–400)
RBC: 4.57 MIL/uL (ref 4.22–5.81)
RDW: 14.8 % (ref 11.5–15.5)
WBC: 11.6 K/uL — ABNORMAL HIGH (ref 4.0–10.5)
nRBC: 0 % (ref 0.0–0.2)

## 2023-11-10 LAB — COMPREHENSIVE METABOLIC PANEL WITH GFR
ALT: 10 U/L (ref 0–44)
AST: 21 U/L (ref 15–41)
Albumin: 4.8 g/dL (ref 3.5–5.0)
Alkaline Phosphatase: 62 U/L (ref 38–126)
Anion gap: 15 (ref 5–15)
BUN: 25 mg/dL — ABNORMAL HIGH (ref 8–23)
CO2: 22 mmol/L (ref 22–32)
Calcium: 9.4 mg/dL (ref 8.9–10.3)
Chloride: 98 mmol/L (ref 98–111)
Creatinine, Ser: 2.48 mg/dL — ABNORMAL HIGH (ref 0.61–1.24)
GFR, Estimated: 26 mL/min — ABNORMAL LOW (ref >60)
Glucose, Bld: 125 mg/dL — ABNORMAL HIGH (ref 70–99)
Potassium: 4 mmol/L (ref 3.5–5.1)
Sodium: 135 mmol/L (ref 135–145)
Total Bilirubin: 0.7 mg/dL (ref 0.0–1.2)
Total Protein: 8 g/dL (ref 6.5–8.1)

## 2023-11-10 LAB — LIPASE, BLOOD: Lipase: 58 U/L — ABNORMAL HIGH (ref 11–51)

## 2023-11-10 MED ORDER — OXYCODONE-ACETAMINOPHEN 5-325 MG PO TABS: 1.0000 | ORAL_TABLET | Freq: Four times a day (QID) | ORAL | 0 refills | Status: AC | PRN

## 2023-11-10 MED ORDER — OXYCODONE-ACETAMINOPHEN 5-325 MG PO TABS: 1.0000 | ORAL_TABLET | Freq: Four times a day (QID) | ORAL | 0 refills | Status: DC | PRN

## 2023-11-10 NOTE — ED Notes (Signed)
 Patient transported to CT

## 2023-11-10 NOTE — ED Triage Notes (Signed)
 Pt POV reporting lower abd pain since Tuesday. Also reporting constipation that resolved with Miralax. Denies n/v.

## 2023-11-10 NOTE — ED Notes (Signed)
 Reviewed discharge instructions, medications, and follow-up care with pt. Pt verbalized understanding and had no further questions. Pt exited ED without complications.

## 2023-11-10 NOTE — Discharge Instructions (Signed)
 Urology should be calling you about following up tomorrow.  If pain gets out of control follow-up at [facility], [place].  Try to keep yourself hydrated.  Do not eat or drink in the morning before you go to see the office.

## 2023-11-10 NOTE — ED Provider Notes (Signed)
 [place] EMERGENCY DEPARTMENT AT [facility] Provider Note   CSN: [id number] Arrival date & time: 11/10/23  8077     Patient presents with: Abdominal Pain   Jeffery [name] is a 73 y.o. male.    Abdominal Pain Patient presents with left-sided abdominal pain.  Has had for the last 2 to 3 days.  Has some constipation.  Took MiraLAX with some relief of the constipation but no relief of the pain.  No nausea or vomiting.  No fevers.  No dysuria.  Pain is in the lower abdomen and sometimes goes to the back.     Prior to Admission medications   Medication Sig Start Date End Date Taking? Authorizing Provider  oxyCODONE -acetaminophen  (PERCOCET/ROXICET) 5-325 MG tablet Take 1 tablet by mouth every 6 (six) hours as needed for severe pain (pain score 7-10). 11/10/23  Yes Patsey Lot, MD  amLODipine  (NORVASC ) 10 MG tablet Take 1 tablet (10 mg total) by mouth daily. 10/18/23   Katrinka Garnette KIDD, MD  hydrochlorothiazide  (HYDRODIURIL ) 25 MG tablet TAKE 1 TABLET(25 MG) BY MOUTH DAILY 09/06/23   Katrinka Garnette KIDD, MD  Multiple Vitamin (MULTIVITAMIN) tablet Take 1 tablet by mouth daily.      [provider]  naproxen sodium (ALEVE) 220 MG tablet Take 220 mg by mouth.    [provider]  rosuvastatin  (CRESTOR ) 10 MG tablet Take 1 tablet (10 mg total) by mouth once a week. 10/18/23   Katrinka Garnette KIDD, MD  tadalafil  (CIALIS ) 5 MG tablet Take 1 tablet (5 mg total) by mouth daily as needed for erectile dysfunction. 10/18/23   Katrinka Garnette KIDD, MD    Allergies: Patient has no known allergies.    Review of Systems  Gastrointestinal:  Positive for abdominal pain.    Updated Vital Signs BP (!) 149/77   Pulse 73   Temp 99.3 F (37.4 C) (Oral)   Resp 15   Ht 5' 7 (1.702 m)   Wt 83 kg   SpO2 95%   BMI 28.66 kg/m   Physical Exam Vitals reviewed.  Cardiovascular:     Rate and Rhythm: Normal rate.  Abdominal:     Tenderness: There is abdominal tenderness.      Comments: Mild lower abdominal tenderness or rebound or guarding.  No hernia palpated.  Neurological:     Mental Status: He is alert.     (all labs ordered are listed, but only abnormal results are displayed) Labs Reviewed  LIPASE, BLOOD - Abnormal; Notable for the following components:      Result Value   Lipase 58 (*)    All other components within normal limits  COMPREHENSIVE METABOLIC PANEL WITH GFR - Abnormal; Notable for the following components:   Glucose, Bld 125 (*)    BUN 25 (*)    Creatinine, Ser 2.48 (*)    GFR, Estimated 26 (*)    All other components within normal limits  CBC - Abnormal; Notable for the following components:   WBC 11.6 (*)    Hemoglobin 11.8 (*)    HCT 36.2 (*)    MCV 79.2 (*)    MCH 25.8 (*)    All other components within normal limits  URINALYSIS, ROUTINE W REFLEX MICROSCOPIC - Abnormal; Notable for the following components:   Hgb urine dipstick TRACE (*)    Bacteria, UA RARE (*)    All other components within normal limits    EKG: None  Radiology: CT ABDOMEN PELVIS WO CONTRAST Result Date: 11/10/2023  CLINICAL DATA:  Lower abdominal pain. EXAM: CT ABDOMEN AND PELVIS WITHOUT CONTRAST TECHNIQUE: Multidetector CT imaging of the abdomen and pelvis was performed following the standard protocol without IV contrast. RADIATION DOSE REDUCTION: This exam was performed according to the departmental dose-optimization program which includes automated exposure control, adjustment of the mA and/or kV according to patient size and/or use of iterative reconstruction technique. COMPARISON:  February 01, 2011 FINDINGS: Lower chest: No acute abnormality. Hepatobiliary: No focal liver abnormality is seen. No gallstones, gallbladder wall thickening, or biliary dilatation. Pancreas: Unremarkable. No pancreatic ductal dilatation or surrounding inflammatory changes. Spleen: Normal in size without focal abnormality. Adrenals/Urinary Tract: Adrenal glands are unremarkable.  Kidneys are normal in size without focal lesions. A 3 mm obstructing renal calculus is seen within the distal left ureter (axial CT image 60, CT series 2), with mild left-sided hydronephrosis and hydroureter. The urinary bladder is poorly distended and subsequently limited in evaluation. Stomach/Bowel: Stomach is within normal limits. Surgically anastomosed bowel is seen along the expected region of the mid sigmoid colon. This is consistent with the patient's history of prior colectomy. No evidence of bowel wall thickening, distention, or inflammatory changes. Vascular/Lymphatic: Aortic atherosclerosis. No enlarged abdominal or pelvic lymph nodes. Reproductive: Prostate gland is moderately enlarged. Other: No abdominal wall hernia or abnormality. No abdominopelvic ascites. Musculoskeletal: No acute or significant osseous findings. IMPRESSION: 1. 3 mm obstructing renal calculus within the distal left ureter. 2. Evidence of prior colectomy. 3. Moderate enlargement of the prostate gland. Correlation with PSA levels is recommended. 4. Aortic atherosclerosis. Electronically Signed   By: Suzen Dials M.D.   On: 11/10/2023 21:13     Procedures   Medications Ordered in the ED - No data to display                                  Medical Decision Making Amount and/or Complexity of Data Reviewed Labs: ordered. Radiology: ordered.  Risk Prescription drug management.   Patient with abdominal pain.  Differential diagnose includes causes such as obstruction, diverticulitis.  Urine reassuring.  However does have a newly bump to creatinine.  Up to 2.5 and it was 1.3 3 weeks ago.  Pain under control.  Discussed with Dr.Trived from urology.  With pain controlled andi reassuring vitals and with no infection will follow-up in the office tomorrow.  He will call tomorrow and arrange to have the creatinine rechecked and potentially intervention done if needed.  Discussed with patient.  Does appear stable for  discharge.  Will give pain medicines.     Final diagnoses:  Kidney stone  AKI (acute kidney injury) [facility])    ED Discharge Orders          Ordered    oxyCODONE -acetaminophen  (PERCOCET/ROXICET) 5-325 MG tablet  Every 6 hours PRN        11/10/23 2139               Patsey Lot, MD 11/10/23 2145

## 2023-11-14 ENCOUNTER — Telehealth: Payer: Self-pay | Admitting: Family Medicine

## 2023-11-14 NOTE — Telephone Encounter (Signed)
 Patient seen in ED on 11/10/23 for kidney stones, would you like me to schedule patient for ED follow up and to discuss Urology referral?

## 2023-11-14 NOTE — Telephone Encounter (Signed)
 Team please see if you can get in contact with patient and potentially [facility]-they noted in the emergency department note that they were going to follow-up with him the next day in the office to recheck his kidney function-seem to the obstruction from the stone was worsening kidney function-did that end up happening?  Can we try to connect them if not.  We can place referral if needed even without a visit and I would place it as urgent and her nephrolithiasis and elevated creatinine

## 2023-11-14 NOTE — Telephone Encounter (Signed)
 Please advise on need for referral to urology.    Copied from CRM #[id number]. Topic: Referral - Request for Referral >> Nov 14, 2023  9:00 AM Rosina BIRCH wrote: Did the patient discuss referral with their provider in the last year? No (If No - schedule appointment) (If Yes - send message)  Appointment offered? No  Type of order/referral and detailed reason for visit: urology referral for small kidney stone  Preference of office, provider, location: [facility] in [place]  If referral order, have you been seen by this specialty before? No (If Yes, this issue or another issue? When? Where?  Can we respond through MyChart? No CB [phone]

## 2023-11-15 DIAGNOSIS — N201 Calculus of ureter: Secondary | ICD-10-CM | POA: Diagnosis not present

## 2023-11-15 NOTE — Telephone Encounter (Signed)
Left vm for pt requesting callback.

## 2023-11-29 DIAGNOSIS — N201 Calculus of ureter: Secondary | ICD-10-CM | POA: Diagnosis not present

## 2023-12-26 ENCOUNTER — Telehealth: Payer: Self-pay | Admitting: Family Medicine

## 2023-12-26 NOTE — Telephone Encounter (Signed)
 Please see patient bp log.    Copied from CRM #[id number]. Topic: General - Other >> Dec 26, 2023 10:52 AM Pinkey ORN wrote: Reason for CRM: Blood Pressure Report >> Dec 26, 2023 10:54 AM Pinkey ORN wrote: Patient is calling to give blood pressure reports  11/07/23 138/69 11/10/23 139/56 (Patient states on this day he had kidney stone, passed it since then) 12/20/23 141/70 12/22/23 129/61 12/26/23 129/56

## 2023-12-26 NOTE — Telephone Encounter (Signed)
 These look to be improving.  No changes in medication.  Thank him for sending these in for us !

## 2024-01-13 ENCOUNTER — Other Ambulatory Visit: Payer: Self-pay | Admitting: Family Medicine

## 2024-02-19 ENCOUNTER — Other Ambulatory Visit: Payer: Self-pay | Admitting: Family Medicine
